# Patient Record
Sex: Female | Born: 1935
Health system: Southern US, Community
[De-identification: ages and names within clinical notes are randomized; demographics above are authoritative.]

---

## 2017-01-18 ENCOUNTER — Inpatient Hospital Stay
Admission: RE | Admit: 2017-01-18 | Discharge: 2017-02-17 | Disposition: E | Payer: Medicare Other | Attending: Internal Medicine | Admitting: Internal Medicine

## 2017-01-18 DIAGNOSIS — T148XXA Other injury of unspecified body region, initial encounter: Secondary | ICD-10-CM

## 2017-01-18 DIAGNOSIS — Z431 Encounter for attention to gastrostomy: Secondary | ICD-10-CM

## 2017-01-18 DIAGNOSIS — J9690 Respiratory failure, unspecified, unspecified whether with hypoxia or hypercapnia: Secondary | ICD-10-CM

## 2017-01-18 DIAGNOSIS — Z4659 Encounter for fitting and adjustment of other gastrointestinal appliance and device: Secondary | ICD-10-CM

## 2017-01-18 DIAGNOSIS — Z931 Gastrostomy status: Secondary | ICD-10-CM

## 2017-01-18 DIAGNOSIS — E46 Unspecified protein-calorie malnutrition: Secondary | ICD-10-CM

## 2017-01-18 DIAGNOSIS — L988 Other specified disorders of the skin and subcutaneous tissue: Secondary | ICD-10-CM

## 2017-01-19 ENCOUNTER — Other Ambulatory Visit (HOSPITAL_COMMUNITY): Payer: Medicare Other

## 2017-01-19 LAB — BASIC METABOLIC PANEL
ANION GAP: 10 (ref 5–15)
BUN: 62 mg/dL — ABNORMAL HIGH (ref 6–20)
CALCIUM: 8.6 mg/dL — AB (ref 8.9–10.3)
CHLORIDE: 100 mmol/L — AB (ref 101–111)
CO2: 26 mmol/L (ref 22–32)
Creatinine, Ser: 1.83 mg/dL — ABNORMAL HIGH (ref 0.44–1.00)
GFR calc non Af Amer: 25 mL/min — ABNORMAL LOW (ref >60)
GFR, EST AFRICAN AMERICAN: 29 mL/min — AB (ref >60)
GLUCOSE: 111 mg/dL — AB (ref 65–99)
POTASSIUM: 4.6 mmol/L (ref 3.5–5.1)
Sodium: 136 mmol/L (ref 135–145)

## 2017-01-19 LAB — PROTIME-INR
INR: 1.22
PROTHROMBIN TIME: 15.4 s — AB (ref 11.4–15.2)

## 2017-01-19 LAB — BLOOD GAS, ARTERIAL
Acid-Base Excess: 1 mmol/L (ref 0.0–2.0)
BICARBONATE: 25 mmol/L (ref 20.0–28.0)
FIO2: 50
O2 Saturation: 98.2 %
PATIENT TEMPERATURE: 97.9
PH ART: 7.427 (ref 7.350–7.450)
pCO2 arterial: 38.3 mmHg (ref 32.0–48.0)
pO2, Arterial: 109 mmHg — ABNORMAL HIGH (ref 83.0–108.0)

## 2017-01-19 LAB — CBC
HCT: 33.7 % — ABNORMAL LOW (ref 36.0–46.0)
HEMOGLOBIN: 10.6 g/dL — AB (ref 12.0–15.0)
MCH: 26.6 pg (ref 26.0–34.0)
MCHC: 31.5 g/dL (ref 30.0–36.0)
MCV: 84.5 fL (ref 78.0–100.0)
Platelets: 206 10*3/uL (ref 150–400)
RBC: 3.99 MIL/uL (ref 3.87–5.11)
RDW: 19.2 % — ABNORMAL HIGH (ref 11.5–15.5)
WBC: 5.7 10*3/uL (ref 4.0–10.5)

## 2017-01-20 ENCOUNTER — Other Ambulatory Visit (HOSPITAL_COMMUNITY): Payer: Medicare Other

## 2017-01-20 NOTE — Consult Note (Signed)
Date: 01/20/2017               Patient Name:  Veronica Mckinney MRN: [id number]  DOB: 1935-08-02 Age / Sex: 81 y.o., female        PCP: No primary care provider on file.              Service Requesting Consult: IM hospitalist              Reason for Consult: ARF       History of Present Illness: Patient is a 81 y.o. female who was admitted to [facility] on 01/23/2017   According to the available information in the chart, patient has medical problems of diabetes, anemia, hypertension, congestive heart failure and atrial fibrillation. She was brought from home when EMS on June 1 for general deconditioning and decline or several days prior to admission. EMS found her on the floor of the trailer. She had some bruising on her right side suggesting she may have fallen previously. Initially she was noted to be severely anemic with hemoglobin of 6, hypothermic, hypotensive, bradycardic with heart rate of 35. She received blood transfusions and supportive care in the ICU. She was admitted for sepsis from UTI and pneumonia.A 2-D echo during this admission showed severe pulmonary hypertension and diastolic dysfunction.When she did not respond to diuresis, she was started on dialysis. It is not clear from the discharge summary when exactly dialysis was started but the patient had a right IJ line placement June 15, trialysis catheter exchange on June 25 and a PermCath placed on June 28  At present, patient is very lethargic, moaning. She is now able to provide any information. She is currently on nasal cannula. She has a Foley catheter but very little urine. About 500 cc of urine output reported in the last 24 hours.    Medications: Outpatient medications: No prescriptions prior to admission.    Current medications: No current facility-administered medications for this encounter.     teflaro Acyclovir 200 mg twice a day Amiodarone 100 mg daily Lipitor 20 mg each bedtime Carbidopa levodopa 1 tablet 3 times a  day Heparin 5000 units subcutaneous Ipratropium albuterol 3 times a day Lantus 20 units twice a day Synthroid 25 mcg daily Pantoprazole 40 mg daily Prednisone 20 mg daily  Allergies: Allergies not on file    Past Medical History: No past medical history on file.   Past Surgical History: No past surgical history on file.   Family History: No family history on file.   Social History: Social History   Social History  . Marital status: N/A    Spouse name: N/A  . Number of children: N/A  . Years of education: N/A   Occupational History  . Not on file.   Social History Main Topics  . Smoking status: Not on file  . Smokeless tobacco: Not on file  . Alcohol use Not on file  . Drug use: Unknown  . Sexual activity: Not on file   Other Topics Concern  . Not on file   Social History Narrative  . No narrative on file     Review of Systems: n/a due to patient being lethargic. Gen:  HEENT:  CV:  Resp:  GI: GU :  MS:  Derm:   Psych: Heme:  Neuro:  Endocrine  Vital Signs: There were no vitals taken for this visit.  Ttemperature 98.7, pulse 66, respirations 24, blood pressure 108/48  Weight trends: There were no vitals filed for  this visit.  Physical Exam: General: Critically ill appearing, laying in the bed  HEENT NG tube in place, and oxygen by nasal cannula  Neck:  supple  Lungs: Bilateral crackles, increased respiratory rate  Heart:: Regular rhythm  Abdomen: Soft, nondistended, significant dependent skin edema in the abdominal pannus, rash under the pannus  Extremities:  some dependent edema in the arms, muscular atrophy in the legs  Neurologic: Lethargic, and did not respond to voice or tactile stimulation, moaning  Skin: Scattered ecchymosis all over her body  Access: Right IJ PermCath  Foley: present       Lab results: Basic Metabolic Panel:  Recent Labs Lab 01/19/17 0652  NA 136  K 4.6  CL 100*  CO2 26  GLUCOSE 111*  BUN 62*   CREATININE 1.83*  CALCIUM 8.6*    Liver Function Tests: No results for input(s): AST, ALT, ALKPHOS, BILITOT, PROT, ALBUMIN in the last 168 hours. No results for input(s): LIPASE, AMYLASE in the last 168 hours. No results for input(s): AMMONIA in the last 168 hours.  CBC:  Recent Labs Lab 01/19/17 0652  WBC 5.7  HGB 10.6*  HCT 33.7*  MCV 84.5  PLT 206    Cardiac Enzymes: No results for input(s): CKTOTAL, TROPONINI in the last 168 hours.  BNP: Invalid input(s): POCBNP  CBG: No results for input(s): GLUCAP in the last 168 hours.  Microbiology: No results found for this or any previous visit (from the past 720 hour(s)).   Coagulation Studies:  Recent Labs  01/19/17 0652  LABPROT 15.4*  INR 1.22    Urinalysis: No results for input(s): COLORURINE, LABSPEC, PHURINE, GLUCOSEU, HGBUR, BILIRUBINUR, KETONESUR, PROTEINUR, UROBILINOGEN, NITRITE, LEUKOCYTESUR in the last 72 hours.  Invalid input(s): APPERANCEUR      Imaging: Dg Chest Port 1 View  Result Date: 01/20/2017 CLINICAL DATA:  Respiratory failure. EXAM: PORTABLE CHEST 1 VIEW COMPARISON:  None. FINDINGS: Nasogastric tube enters the abdomen. Right internal jugular central line tips at the SVC RA junction or proximal right atrium. The patient is rotated markedly towards the right. There is probably pulmonary edema. There is a right effusion with right lower lobe collapse. Abnormal density in the left lower lobe could represent pneumonia. IMPRESSION: Markedly rotated film. Right effusion with right lower lobe collapse. Possible left lower lobe pneumonia. Suspicion of pulmonary edema/ fluid overload. Electronically Signed   By: Paulina FusiMark  Shogry M.D.   On: 01/20/2017 07:48   Dg Abd Portable 1v  Result Date: 01/19/2017 CLINICAL DATA:  81 y/o  F; nasogastric tube placement. EXAM: PORTABLE ABDOMEN - 1 VIEW COMPARISON:  None. FINDINGS: Normal bowel gas pattern. Enteric tube tip projects over right lower quadrant likely in distal  stomach. Right upper quadrant surgical clips from cholecystectomy. Ill-defined patchy opacifications of the lungs and small bilateral pleural effusions. Mildly enlarged cardiac silhouette. No radiopaque urinary stone disease identified. IMPRESSION: Enteric tube tip likely in distal stomach. Normal bowel gas pattern. Electronically Signed   By: Mitzi HansenLance  Furusawa-Stratton M.D.   On: 01/19/2017 04:47      Assessment & Plan: Pt is a 81 y.o. caucsian female  With diabetes, atrial fibrillation, hypertension, congestive heart failure, pulmonary hypertension, diastolic dysfunction.She was treated for sepsis from pneumonia and UTI, peri anal HSV at the outside hospital. Started on dialysis for volume overload and poor urine output. [facility]  1. Acute renal failure Baseline creatinine is unknown UOP is poor at present Patient was getting dialysis at the outside hospital.  It is not known when her last treatment was done. We will continue dialysis treatments 3 times a week for now and evaluate Plan for dialysis today for volume removal as exam and Chest x-ray suggests pulmonary edema  2. Acute pulmonary edema, right lower lobe pneumonia - Antibiotics as per primary team - dialysis today for fluid removal

## 2017-01-21 LAB — HEPATITIS B SURFACE ANTIBODY,QUALITATIVE: HEP B S AB: NONREACTIVE

## 2017-01-21 LAB — HEPATITIS B CORE ANTIBODY, IGM: Hep B C IgM: NEGATIVE

## 2017-01-21 LAB — HEPATITIS B SURFACE ANTIGEN: HEP B S AG: NEGATIVE

## 2017-01-22 LAB — CBC
HEMATOCRIT: 28.6 % — AB (ref 36.0–46.0)
HEMOGLOBIN: 9 g/dL — AB (ref 12.0–15.0)
MCH: 26.3 pg (ref 26.0–34.0)
MCHC: 31.5 g/dL (ref 30.0–36.0)
MCV: 83.6 fL (ref 78.0–100.0)
Platelets: 224 10*3/uL (ref 150–400)
RBC: 3.42 MIL/uL — ABNORMAL LOW (ref 3.87–5.11)
RDW: 19.2 % — ABNORMAL HIGH (ref 11.5–15.5)
WBC: 7 10*3/uL (ref 4.0–10.5)

## 2017-01-22 LAB — RENAL FUNCTION PANEL
ANION GAP: 10 (ref 5–15)
Albumin: 1.9 g/dL — ABNORMAL LOW (ref 3.5–5.0)
BUN: 114 mg/dL — ABNORMAL HIGH (ref 6–20)
CHLORIDE: 102 mmol/L (ref 101–111)
CO2: 23 mmol/L (ref 22–32)
Calcium: 8.3 mg/dL — ABNORMAL LOW (ref 8.9–10.3)
Creatinine, Ser: 2.97 mg/dL — ABNORMAL HIGH (ref 0.44–1.00)
GFR calc non Af Amer: 14 mL/min — ABNORMAL LOW (ref >60)
GFR, EST AFRICAN AMERICAN: 16 mL/min — AB (ref >60)
GLUCOSE: 100 mg/dL — AB (ref 65–99)
POTASSIUM: 4.8 mmol/L (ref 3.5–5.1)
Phosphorus: 5.9 mg/dL — ABNORMAL HIGH (ref 2.5–4.6)
SODIUM: 135 mmol/L (ref 135–145)

## 2017-01-22 NOTE — Progress Notes (Signed)
  01/22/17  Subjective:   Patient seen during HD Dialysis being done in chair Patient still lethargic; not responding to voice / stimulation   Objective:  Vital signs in last 24 hours:     T 95.7  RR 17  HR 73  BP 107/55  Intake/Output:   No intake or output data in the 24 hours ending 01/22/17 1533   Physical Exam: General: NAD, sitting up in chair  HEENT NG tube in place  Neck supple  Pulm/lungs Coarse b/l  CVS/Heart regular  Abdomen:  soft  Extremities: No edema  Neurologic: Lethargic, not following commands     Access: Rt IJ PC       Basic Metabolic Panel:   Recent Labs Lab 01/19/17 0652 01/22/17 0603  NA 136 135  K 4.6 4.8  CL 100* 102  CO2 26 23  GLUCOSE 111* 100*  BUN 62* 114*  CREATININE 1.83* 2.97*  CALCIUM 8.6* 8.3*  PHOS  --  5.9*     CBC:  Recent Labs Lab 01/19/17 0652 01/22/17 0603  WBC 5.7 7.0  HGB 10.6* 9.0*  HCT 33.7* 28.6*  MCV 84.5 83.6  PLT 206 224     Lab Results  Component Value Date   HEPBSAG Negative 01/20/2017   HEPBSAB Non Reactive 01/20/2017   HEPBIGM Negative 01/20/2017      Microbiology:  No results found for this or any previous visit (from the past 240 hour(s)).  Coagulation Studies: No results for input(s): LABPROT, INR in the last 72 hours.  Urinalysis: No results for input(s): COLORURINE, LABSPEC, PHURINE, GLUCOSEU, HGBUR, BILIRUBINUR, KETONESUR, PROTEINUR, UROBILINOGEN, NITRITE, LEUKOCYTESUR in the last 72 hours.  Invalid input(s): APPERANCEUR    Imaging: No results found.   Medications:       Assessment/ Plan:  81 y.o. 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 Started on dialysis for volume overload and poor urine output. [facility]  1. Acute renal  failure Baseline creatinine is unknown UOP is poor at present; S Creatinine and BUN have worsened Patient was getting dialysis at the outside hospital. Probably started between June 15 and June 25 We will continue dialysis treatments 3 times a week for now and evaluate Plan for dialysis today and Monday. Further plan as per patient progress - UF goal 2 L for monday  2. Acute pulmonary edema, right lower lobe pneumonia - Antibiotics as per primary team   3. Hyperphosphatemia - check PTh - start Calcium acetate 1334 TID   LOS: 0 [facility],Keonta Monceaux 7/6/20183:33 PM  [facility] [place], [place] [phone]

## 2017-01-25 LAB — RENAL FUNCTION PANEL
Albumin: 1.7 g/dL — ABNORMAL LOW (ref 3.5–5.0)
Anion gap: 9 (ref 5–15)
BUN: 111 mg/dL — ABNORMAL HIGH (ref 6–20)
CHLORIDE: 97 mmol/L — AB (ref 101–111)
CO2: 25 mmol/L (ref 22–32)
CREATININE: 3.05 mg/dL — AB (ref 0.44–1.00)
Calcium: 8.5 mg/dL — ABNORMAL LOW (ref 8.9–10.3)
GFR, EST AFRICAN AMERICAN: 16 mL/min — AB (ref >60)
GFR, EST NON AFRICAN AMERICAN: 14 mL/min — AB (ref >60)
Glucose, Bld: 263 mg/dL — ABNORMAL HIGH (ref 65–99)
Phosphorus: 4.6 mg/dL (ref 2.5–4.6)
Potassium: 4.9 mmol/L (ref 3.5–5.1)
Sodium: 131 mmol/L — ABNORMAL LOW (ref 135–145)

## 2017-01-25 LAB — CBC
HEMATOCRIT: 25.8 % — AB (ref 36.0–46.0)
HEMOGLOBIN: 8.1 g/dL — AB (ref 12.0–15.0)
MCH: 26.1 pg (ref 26.0–34.0)
MCHC: 31.4 g/dL (ref 30.0–36.0)
MCV: 83.2 fL (ref 78.0–100.0)
PLATELETS: 203 10*3/uL (ref 150–400)
RBC: 3.1 MIL/uL — AB (ref 3.87–5.11)
RDW: 18.8 % — ABNORMAL HIGH (ref 11.5–15.5)
WBC: 6 10*3/uL (ref 4.0–10.5)

## 2017-01-25 LAB — VANCOMYCIN, TROUGH: VANCOMYCIN TR: 8 ug/mL — AB (ref 15–20)

## 2017-01-25 NOTE — Progress Notes (Signed)
  01/25/17  Subjective:  Patient seen after hemodialysis. She appears to have tolerated well. Ultrafiltration achieved was 2 kg.  Objective:  Vital signs in last 24 hours:  Temperature 97.7 pulse 63 respirations 16 blood pressure 113/54  Physical Exam: General: NAD, laying in bed  HEENT [place]/AT NG tube in place  Neck supple  Pulm/lungs Coarse b/l, normal effort  CVS/Heart S1S2 no rubs  Abdomen:  Soft NT, BS present  Extremities: No edema  Neurologic: Lethargic, not following commands     Access: Rt IJ PC       Basic Metabolic Panel:   Recent Labs Lab 01/19/17 0652 01/22/17 0603 01/25/17 0717  NA 136 135 131*  K 4.6 4.8 4.9  CL 100* 102 97*  CO2 26 23 25   GLUCOSE 111* 100* 263*  BUN 62* 114* 111*  CREATININE 1.83* 2.97* 3.05*  CALCIUM 8.6* 8.3* 8.5*  PHOS  --  5.9* 4.6     CBC:  Recent Labs Lab 01/19/17 0652 01/22/17 0603 01/25/17 0717  WBC 5.7 7.0 6.0  HGB 10.6* 9.0* 8.1*  HCT 33.7* 28.6* 25.8*  MCV 84.5 83.6 83.2  PLT 206 224 203      Lab Results  Component Value Date   HEPBSAG Negative 01/20/2017   HEPBSAB Non Reactive 01/20/2017   HEPBIGM Negative 01/20/2017      Microbiology:  No results found for this or any previous visit (from the past 240 hour(s)).  Coagulation Studies: No results for input(s): LABPROT, INR in the last 72 hours.  Urinalysis: No results for input(s): COLORURINE, LABSPEC, PHURINE, GLUCOSEU, HGBUR, BILIRUBINUR, KETONESUR, PROTEINUR, UROBILINOGEN, NITRITE, LEUKOCYTESUR in the last 72 hours.  Invalid input(s): APPERANCEUR    Imaging: No results found.   Medications:       Assessment/ Plan:  81 y.o. 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 Started on dialysis for volume overload and poor urine output. [facility]  1. Acute renal failure Baseline creatinine is unknown UOP is poor at present; S Creatinine and BUN have worsened -  Patient seen post hemodialysis. Ultrafiltration achieved was 2 L. BUN remains to be quite high. Continue to monitor BUN and creatinine trend as well as urine output.  We will plan for dialysis again on Wednesday.  2. Acute pulmonary edema, right lower lobe pneumonia - Continue ultrafiltration with hemodialysis.  3. Secondary hyperparathyroidism. Phosphorus currently 4.6. Continue to monitor phosphorus with dialysis.   LOS: 0 Veronica HaagensenLATEEF, Veronica Mckinney 7/9/20183:36 PM  [facility] [place], [place] [phone]

## 2017-01-26 LAB — C DIFFICILE QUICK SCREEN W PCR REFLEX
C DIFFICLE (CDIFF) ANTIGEN: NEGATIVE
C Diff interpretation: NOT DETECTED
C Diff toxin: NEGATIVE

## 2017-01-27 LAB — VANCOMYCIN, TROUGH: Vancomycin Tr: 22 ug/mL (ref 15–20)

## 2017-01-27 LAB — RENAL FUNCTION PANEL
ALBUMIN: 1.7 g/dL — AB (ref 3.5–5.0)
ANION GAP: 6 (ref 5–15)
BUN: 78 mg/dL — ABNORMAL HIGH (ref 6–20)
CO2: 26 mmol/L (ref 22–32)
CREATININE: 2.22 mg/dL — AB (ref 0.44–1.00)
Calcium: 8.5 mg/dL — ABNORMAL LOW (ref 8.9–10.3)
Chloride: 99 mmol/L — ABNORMAL LOW (ref 101–111)
GFR calc Af Amer: 23 mL/min — ABNORMAL LOW (ref >60)
GFR calc non Af Amer: 20 mL/min — ABNORMAL LOW (ref >60)
GLUCOSE: 129 mg/dL — AB (ref 65–99)
PHOSPHORUS: 3.8 mg/dL (ref 2.5–4.6)
Potassium: 4.7 mmol/L (ref 3.5–5.1)
Sodium: 131 mmol/L — ABNORMAL LOW (ref 135–145)

## 2017-01-27 LAB — CBC
HCT: 29 % — ABNORMAL LOW (ref 36.0–46.0)
HEMOGLOBIN: 9.1 g/dL — AB (ref 12.0–15.0)
MCH: 26.7 pg (ref 26.0–34.0)
MCHC: 31.4 g/dL (ref 30.0–36.0)
MCV: 85 fL (ref 78.0–100.0)
PLATELETS: 167 10*3/uL (ref 150–400)
RBC: 3.41 MIL/uL — AB (ref 3.87–5.11)
RDW: 19 % — ABNORMAL HIGH (ref 11.5–15.5)
WBC: 7.3 10*3/uL (ref 4.0–10.5)

## 2017-01-27 NOTE — Progress Notes (Signed)
  01/27/17  Subjective:  Patient seems much more awake and alert at the moment. She is due for dialysis today.  orders have been prepared.  Objective:  Vital signs in last 24 hours:  Temperature 98 pulse 56 respirations 12 blood pressure 137/64  Physical Exam: General: NAD, laying in bed  HEENT [place]/AT NG tube in place  Neck supple  Pulm/lungs Coarse b/l, normal effort  CVS/Heart S1S2 no rubs  Abdomen:  Soft NT, BS present  Extremities: No edema  Neurologic: Lethargic, not following commands     Access: Rt IJ PC       Basic Metabolic Panel:   Recent Labs Lab 01/22/17 0603 01/25/17 0717 01/27/17 0656  NA 135 131* 131*  K 4.8 4.9 4.7  CL 102 97* 99*  CO2 23 25 26   GLUCOSE 100* 263* 129*  BUN 114* 111* 78*  CREATININE 2.97* 3.05* 2.22*  CALCIUM 8.3* 8.5* 8.5*  PHOS 5.9* 4.6 3.8     CBC:  Recent Labs Lab 01/22/17 0603 01/25/17 0717 01/27/17 0656  WBC 7.0 6.0 7.3  HGB 9.0* 8.1* 9.1*  HCT 28.6* 25.8* 29.0*  MCV 83.6 83.2 85.0  PLT 224 203 167      Lab Results  Component Value Date   HEPBSAG Negative 01/20/2017   HEPBSAB Non Reactive 01/20/2017   HEPBIGM Negative 01/20/2017      Microbiology:  Recent Results (from the past 240 hour(s))  C difficile quick scan w PCR reflex     Status: None   Collection Time: 01/25/17  6:57 AM  Result Value Ref Range Status   C Diff antigen NEGATIVE NEGATIVE Final   C Diff toxin NEGATIVE NEGATIVE Final   C Diff interpretation No C. difficile detected.  Final    Coagulation Studies: No results for input(s): LABPROT, INR in the last 72 hours.  Urinalysis: No results for input(s): COLORURINE, LABSPEC, PHURINE, GLUCOSEU, HGBUR, BILIRUBINUR, KETONESUR, PROTEINUR, UROBILINOGEN, NITRITE, LEUKOCYTESUR in the last 72 hours.  Invalid input(s): APPERANCEUR    Imaging: No results found.   Medications:       Assessment/ Plan:  81 y.o. female with diabetes, atrial fibrillation, hypertension, congestive heart  failure, pulmonary hypertension, diastolic dysfunction.She was treated for sepsis from pneumonia and UTI, peri anal HSV at the outside hospital. Started on dialysis for volume overload and poor urine output. [facility]  1. Acute renal failure Baseline creatinine is unknown -  Patient due for hemodialysis today. We will plan for dialysis tentatively for Friday as well. Hopefully her renal function will improve with time.  2. Acute pulmonary edema, right lower lobe pneumonia - Breathing comfortably at the moment. We will continue to plan ultrafiltration target's of 1-1.5 kg daily.  3. Secondary hyperparathyroidism.  Phosphorus down to 3.8. Continue to monitor bone mineral metabolism parameters.  LOS: 0 Mady HaagensenLATEEF, Thetis Schwimmer 7/11/20183:17 PM  [facility] [place], [place] [phone]

## 2017-01-29 LAB — RENAL FUNCTION PANEL
ALBUMIN: 1.6 g/dL — AB (ref 3.5–5.0)
Anion gap: 6 (ref 5–15)
BUN: 61 mg/dL — ABNORMAL HIGH (ref 6–20)
CALCIUM: 8.4 mg/dL — AB (ref 8.9–10.3)
CO2: 26 mmol/L (ref 22–32)
CREATININE: 1.97 mg/dL — AB (ref 0.44–1.00)
Chloride: 99 mmol/L — ABNORMAL LOW (ref 101–111)
GFR calc non Af Amer: 23 mL/min — ABNORMAL LOW (ref >60)
GFR, EST AFRICAN AMERICAN: 27 mL/min — AB (ref >60)
Glucose, Bld: 65 mg/dL (ref 65–99)
PHOSPHORUS: 2.4 mg/dL — AB (ref 2.5–4.6)
Potassium: 3.9 mmol/L (ref 3.5–5.1)
SODIUM: 131 mmol/L — AB (ref 135–145)

## 2017-01-29 LAB — CBC
HCT: 24.1 % — ABNORMAL LOW (ref 36.0–46.0)
Hemoglobin: 7.8 g/dL — ABNORMAL LOW (ref 12.0–15.0)
MCH: 26.9 pg (ref 26.0–34.0)
MCHC: 32.4 g/dL (ref 30.0–36.0)
MCV: 83.1 fL (ref 78.0–100.0)
PLATELETS: 103 10*3/uL — AB (ref 150–400)
RBC: 2.9 MIL/uL — AB (ref 3.87–5.11)
RDW: 19 % — ABNORMAL HIGH (ref 11.5–15.5)
WBC: 8.1 10*3/uL (ref 4.0–10.5)

## 2017-01-29 NOTE — Progress Notes (Signed)
  01/29/17  Subjective:  Patient appears to be lethargic today. She will be due for dialysis later today.   Objective:  Vital signs in last 24 hours:  Temperature 100.8 pulse 75 respirations 18 blood pressure 122/61  Physical Exam: General: NAD, laying in bed  HEENT [place]/AT NG tube in place  Neck supple  Pulm/lungs Coarse b/l, normal effort  CVS/Heart S1S2 no rubs  Abdomen:  Soft NT, BS present  Extremities: No edema  Neurologic: Lethargic, not following commands  Skin: Scattered ecchymoses  Access: Rt IJ PC       Basic Metabolic Panel:   Recent Labs Lab 01/25/17 0717 01/27/17 0656 01/29/17 0455  NA 131* 131* 131*  K 4.9 4.7 3.9  CL 97* 99* 99*  CO2 25 26 26   GLUCOSE 263* 129* 65  BUN 111* 78* 61*  CREATININE 3.05* 2.22* 1.97*  CALCIUM 8.5* 8.5* 8.4*  PHOS 4.6 3.8 2.4*     CBC:  Recent Labs Lab 01/25/17 0717 01/27/17 0656 01/29/17 0455  WBC 6.0 7.3 8.1  HGB 8.1* 9.1* 7.8*  HCT 25.8* 29.0* 24.1*  MCV 83.2 85.0 83.1  PLT 203 167 103*      Lab Results  Component Value Date   HEPBSAG Negative 01/20/2017   HEPBSAB Non Reactive 01/20/2017   HEPBIGM Negative 01/20/2017      Microbiology:  Recent Results (from the past 240 hour(s))  C difficile quick scan w PCR reflex     Status: None   Collection Time: 01/25/17  6:57 AM  Result Value Ref Range Status   C Diff antigen NEGATIVE NEGATIVE Final   C Diff toxin NEGATIVE NEGATIVE Final   C Diff interpretation No C. difficile detected.  Final    Coagulation Studies: No results for input(s): LABPROT, INR in the last 72 hours.  Urinalysis: No results for input(s): COLORURINE, LABSPEC, PHURINE, GLUCOSEU, HGBUR, BILIRUBINUR, KETONESUR, PROTEINUR, UROBILINOGEN, NITRITE, LEUKOCYTESUR in the last 72 hours.  Invalid input(s): APPERANCEUR    Imaging: No results found.   Medications:       Assessment/ Plan:  81 y.o. female with diabetes, atrial fibrillation, hypertension, congestive heart  failure, pulmonary hypertension, diastolic dysfunction.She was treated for sepsis from pneumonia and UTI, peri anal HSV at the outside hospital. Started on dialysis for volume overload and poor urine output. [facility]  1. Acute renal failure Baseline creatinine is unknown - We will plan for dialysis again today. Thereafter we will plan for dialysis again on Monday. Unclear as to whether she will regain enough kidney function to be off of dialysis.  2. Acute pulmonary edema, right lower lobe pneumonia - Ultrafiltration target 1.5 kg today.  3. Secondary hyperparathyroidism.  Phos currently 2.4, continue to monitor phos trend.     LOS: 0 Veronica HaagensenLATEEF, Veronica Mckinney 7/13/20188:18 AM  [facility] Veronica Mckinney, [place] [phone]

## 2017-02-01 LAB — CBC
HEMATOCRIT: 25.6 % — AB (ref 36.0–46.0)
HEMOGLOBIN: 8.1 g/dL — AB (ref 12.0–15.0)
MCH: 26.6 pg (ref 26.0–34.0)
MCHC: 31.6 g/dL (ref 30.0–36.0)
MCV: 84.2 fL (ref 78.0–100.0)
Platelets: 89 10*3/uL — ABNORMAL LOW (ref 150–400)
RBC: 3.04 MIL/uL — ABNORMAL LOW (ref 3.87–5.11)
RDW: 19.7 % — ABNORMAL HIGH (ref 11.5–15.5)
WBC: 5.6 10*3/uL (ref 4.0–10.5)

## 2017-02-01 LAB — RENAL FUNCTION PANEL
ALBUMIN: 1.6 g/dL — AB (ref 3.5–5.0)
ANION GAP: 6 (ref 5–15)
BUN: 89 mg/dL — ABNORMAL HIGH (ref 6–20)
CALCIUM: 8.9 mg/dL (ref 8.9–10.3)
CO2: 28 mmol/L (ref 22–32)
Chloride: 94 mmol/L — ABNORMAL LOW (ref 101–111)
Creatinine, Ser: 2.48 mg/dL — ABNORMAL HIGH (ref 0.44–1.00)
GFR calc Af Amer: 20 mL/min — ABNORMAL LOW (ref >60)
GFR, EST NON AFRICAN AMERICAN: 17 mL/min — AB (ref >60)
Glucose, Bld: 99 mg/dL (ref 65–99)
PHOSPHORUS: 2.8 mg/dL (ref 2.5–4.6)
POTASSIUM: 4.2 mmol/L (ref 3.5–5.1)
Sodium: 128 mmol/L — ABNORMAL LOW (ref 135–145)

## 2017-02-01 LAB — PROTIME-INR
INR: 1.08
PROTHROMBIN TIME: 14 s (ref 11.4–15.2)

## 2017-02-01 NOTE — Progress Notes (Signed)
  02/01/17  Subjective:  Patient continues to be lethargic. Patient seen during dialysis Tolerating well    Objective:  Vital signs in last 24 hours:  Temperature  94.1, pulse 60, respirations 18, blood pressure 120/57  Physical Exam: General: NAD, laying in bed  HEENT [place]/AT NG tube in place  Neck supple  Pulm/lungs Coarse b/l, normal effort  CVS/Heart Irregular rhythm   Abdomen:  Soft NT, BS present  Extremities: + edema  Neurologic: Lethargic, not following commands  Skin: Scattered ecchymoses  Access: Rt IJ PC       Basic Metabolic Panel:   Recent Labs Lab 01/27/17 0656 01/29/17 0455 02/01/17 0659  NA 131* 131* 128*  K 4.7 3.9 4.2  CL 99* 99* 94*  CO2 26 26 28   GLUCOSE 129* 65 99  BUN 78* 61* 89*  CREATININE 2.22* 1.97* 2.48*  CALCIUM 8.5* 8.4* 8.9  PHOS 3.8 2.4* 2.8     CBC:  Recent Labs Lab 01/27/17 0656 01/29/17 0455 02/01/17 0659  WBC 7.3 8.1 5.6  HGB 9.1* 7.8* 8.1*  HCT 29.0* 24.1* 25.6*  MCV 85.0 83.1 84.2  PLT 167 103* 89*      Lab Results  Component Value Date   HEPBSAG Negative 01/20/2017   HEPBSAB Non Reactive 01/20/2017   HEPBIGM Negative 01/20/2017      Microbiology:  Recent Results (from the past 240 hour(s))  C difficile quick scan w PCR reflex     Status: None   Collection Time: 01/25/17  6:57 AM  Result Value Ref Range Status   C Diff antigen NEGATIVE NEGATIVE Final   C Diff toxin NEGATIVE NEGATIVE Final   C Diff interpretation No C. difficile detected.  Final    Coagulation Studies:  Recent Labs  02/01/17 0659  LABPROT 14.0  INR 1.08    Urinalysis: No results for input(s): COLORURINE, LABSPEC, PHURINE, GLUCOSEU, HGBUR, BILIRUBINUR, KETONESUR, PROTEINUR, UROBILINOGEN, NITRITE, LEUKOCYTESUR in the last 72 hours.  Invalid input(s): APPERANCEUR    Imaging: No results found.   Medications:       Assessment/ Plan:  81 y.o. female with diabetes, atrial fibrillation, hypertension, congestive heart  failure, pulmonary hypertension, diastolic dysfunction.She was treated for sepsis from pneumonia and UTI, peri anal HSV at the outside hospital. Started on dialysis for volume overload and poor urine output. [facility]  1. Acute renal failure Baseline creatinine is unknown - Patient to continue dialysis on M-W-F schedule for now - bladder incontinence- UOP not recorded - BUN/Cr remain high  2. Acute pulmonary edema, right lower lobe pneumonia - Ultrafiltration with HD as tolerated  Consider d/c maintenance iv fluids    LOS: 0 [facility]H,Jaiveon Suppes 7/16/20183:25 PM  [facility] [place], [place] [phone]

## 2017-02-02 ENCOUNTER — Other Ambulatory Visit (HOSPITAL_COMMUNITY): Payer: Medicare Other

## 2017-02-02 MED ORDER — IOPAMIDOL (ISOVUE-300) INJECTION 61%
100.0000 mL | Freq: Once | INTRAVENOUS | Status: AC | PRN
  Administered 2017-02-02: 100 mL via INTRAVENOUS

## 2017-02-02 NOTE — Progress Notes (Signed)
Patient ID: Veronica Mckinney, female   DOB: [birth year], 81 y.o.   MRN: [id number]  Request has been made for percutaneous gastric tube placement Renal failure; CHF; Afib; malnutrition Deconditioning; dysphagia Dr Archer AsaMcCullough has reviewed imaging and approves procedure But Several additional findings noted:  have been made aware to Priya NP Will HOLD on G tube until PMD has had a chance to evaluate the findings of CT plt 89 yesterday-HIT panel ongoing Heparin injection held  CT Abd IMPRESSION: 1. Incompletely imaged multifocal nodular opacities throughout the lungs the largest of which measures 1.5 cm in the left upper lobe. Differential considerations include multifocal pneumonia, and metastatic disease. If the patient does not clearly have multifocal pneumonia, then further evaluation with a dedicated chest CT is recommended. If the patient does have clinical symptoms of a pneumonia, then a follow-up CT scan of the chest is recommended in 4- 6 weeks following an appropriate course of therapy to document resolution of the nodular opacities. 2. Intermediate attenuation density in the subcutaneous fat of the right upper chest superior to the breast is favored to represent a subcutaneous hematoma. An incompletely imaged and superiorly displaced breast prostheses or other soft tissue mass is possible but considered less likely given the density in the absence of associated calcifications. This finding could also be further evaluated by CT scan of the chest. 3. Incompletely imaged but suspected left psoas hematoma versus abscess with adjacent retroperitoneal fluid collection. Consider further evaluation with a CT scan of the abdomen and pelvis with intravenous contrast. 4. Normal gastric anatomy for percutaneous gastrostomy tube placement. 5. Mild intra and extrahepatic biliary ductal dilatation is favored to be secondary to patient age and post cholecystectomy status. Recommend clinical  correlation with serum LFTs and bilirubin to evaluate for evidence of obstruction. 6.  Aortic Atherosclerosis (ICD10-170.0) 7. Cardiomegaly. 8. Coronary artery calcifications. 9. Small bilateral pleural effusions. 10. Anasarca. 11. The tip of the nasogastric tube is positioned in the first portion of the duodenum. Consider pulling back several cm for more optimal placement. 12. Well-positioned hemodialysis catheter with the tip at the superior cavoatrial junction    [facility] to let us know plan when appropriate

## 2017-02-02 NOTE — Consult Note (Deleted)
Chief Complaint: Patient was seen in consultation today for tunneled dialysis catheter placement at the request of Dr Ardeth SportsmanA Hijazi  Referring Physician(s): Dr A Hijazi Dr Mosetta PigeonHarmeet Singh  Supervising Physician: Irish LackYamagata, Glenn  [facility]  History of Present Illness: Veronica Mckinney is a 81 y.o. female   SOB; PNA +small PE Lovenox inj daily Respiratory failure Intubated and extubated Re intubated 7/2---to [facility] Acute renal failure Temporary dialysis catheter in Right femoral access Now pt with temp of 101.6 Wbc wnl Lovenox held Request for tunneled dialysis catheter placement   No past medical history on file.  No past surgical history on file.  Allergies: Patient has no allergy information on record.  Medications: Prior to Admission medications   Not on File     No family history on file.  Social History   Social History  . Marital status: N/A    Spouse name: N/A  . Number of children: N/A  . Years of education: N/A   Social History Main Topics  . Smoking status: Not on file  . Smokeless tobacco: Not on file  . Alcohol use Not on file  . Drug use: Unknown  . Sexual activity: Not on file   Other Topics Concern  . Not on file   Social History Narrative  . No narrative on file    Review of Systems: A 12 point ROS discussed and pertinent positives are indicated in the HPI above.  All other systems are negative.  Review of Systems  Respiratory:       Vent    Vital Signs: There were no vitals taken for this visit.  Physical Exam  Cardiovascular: Normal rate and regular rhythm.   Pulmonary/Chest:  vent  Abdominal: Soft.  Musculoskeletal:  No movement   Neurological:  No response  Skin: Skin is warm.  Nursing note and vitals reviewed.   Mallampati Score:  MD Evaluation Airway: Other (comments) Airway comments: vent Heart: WNL Abdomen: WNL Chest/ Lungs: Other (comments) Chest/ lungs comments: vent ASA   Classification: 3 Mallampati/Airway Score: Three  Imaging: Ct Abdomen Wo Contrast  Result Date: 02/02/2017 CLINICAL DATA:  81 year old female with protein calorie malnutrition. Evaluate anatomy for percutaneous gastrostomy tube placement. EXAM: CT ABDOMEN WITHOUT CONTRAST TECHNIQUE: Multidetector CT imaging of the abdomen was performed following the standard protocol without IV contrast. COMPARISON:  Abdominal radiograph 01/19/2017; chest x-ray 01/20/2017 FINDINGS: Lower chest: Respiratory motion limits evaluation of the visualized lower lungs. However, there is a 1.5 cm nodular opacity in the left upper lobe with additional patchy and somewhat nodular opacities in the left lower lobe. The bronchial wall thickening and bronchiectasis is present in the visualized portion of the right lung with partial atelectasis and/ or scarring in the right middle lobe and lingula. A few small nodular opacities are also present in the right lower lobe. Small bilateral pleural effusions. Cardiomegaly. Extensive calcifications along the course of the coronary arteries. A tunneled hemodialysis catheter is present. The tip terminates at the superior cavoatrial junction. A nasogastric tube is present. The tip terminates in the first portion of the duodenum. Hepatobiliary: The gallbladder is surgically absent. Mild intra and extrahepatic biliary duct dilatation without definite mass or stone. The liver parenchyma is high in attenuation. Pancreas: Atrophic.  No definite mass. Spleen: Normal in size without focal abnormality. Adrenals/Urinary Tract: No focal adrenal mass. No hydronephrosis or nephrolithiasis. Stomach/Bowel: No evidence of bowel obstruction or focal bowel wall thickening in the visualized portions. Normal gastric  anatomy. No evidence of bypass surgery or other abnormality. Vascular/Lymphatic: Limited evaluation in the absence of intravenous contrast. Calcifications present throughout the abdominal aorta. No aneurysm  visualized. Other: Extensive skin thickening and interstitial stranding throughout the subcutaneous fat consistent with anasarca. Musculoskeletal: Incompletely imaged 8.8 x 4.4 cm high attenuation soft tissue mass in the subcutaneous fat of the right upper chest. This abnormality lies above the breast. The contralateral side is unremarkable. Additionally, there is asymmetry in the psoas muscles with relative high attenuation and expansion on the left psoas muscle concerning for retroperitoneal hematoma. Additionally, there are what appear to be fluid collections in the retroperitoneal fat posterior to the left psoas muscle. IMPRESSION: 1. Incompletely imaged multifocal nodular opacities throughout the lungs the largest of which measures 1.5 cm in the left upper lobe. Differential considerations include multifocal pneumonia, and metastatic disease. If the patient does not clearly have multifocal pneumonia, then further evaluation with a dedicated chest CT is recommended. If the patient does have clinical symptoms of a pneumonia, then a follow-up CT scan of the chest is recommended in 4- 6 weeks following an appropriate course of therapy to document resolution of the nodular opacities. 2. Intermediate attenuation density in the subcutaneous fat of the right upper chest superior to the breast is favored to represent a subcutaneous hematoma. An incompletely imaged and superiorly displaced breast prostheses or other soft tissue mass is possible but considered less likely given the density in the absence of associated calcifications. This finding could also be further evaluated by CT scan of the chest. 3. Incompletely imaged but suspected left psoas hematoma versus abscess with adjacent retroperitoneal fluid collection. Consider further evaluation with a CT scan of the abdomen and pelvis with intravenous contrast. 4. Normal gastric anatomy for percutaneous gastrostomy tube placement. 5. Mild intra and extrahepatic biliary  ductal dilatation is favored to be secondary to patient age and post cholecystectomy status. Recommend clinical correlation with serum LFTs and bilirubin to evaluate for evidence of obstruction. 6.  Aortic Atherosclerosis (ICD10-170.0) 7. Cardiomegaly. 8. Coronary artery calcifications. 9. Small bilateral pleural effusions. 10. Anasarca. 11. The tip of the nasogastric tube is positioned in the first portion of the duodenum. Consider pulling back several cm for more optimal placement. 12. Well-positioned hemodialysis catheter with the tip at the superior cavoatrial junction. Electronically Signed   By: Malachy Moan M.D.   On: 02/02/2017 07:52   Dg Chest Port 1 View  Result Date: 01/20/2017 CLINICAL DATA:  Respiratory failure. EXAM: PORTABLE CHEST 1 VIEW COMPARISON:  None. FINDINGS: Nasogastric tube enters the abdomen. Right internal jugular central line tips at the SVC RA junction or proximal right atrium. The patient is rotated markedly towards the right. There is probably pulmonary edema. There is a right effusion with right lower lobe collapse. Abnormal density in the left lower lobe could represent pneumonia. IMPRESSION: Markedly rotated film. Right effusion with right lower lobe collapse. Possible left lower lobe pneumonia. Suspicion of pulmonary edema/ fluid overload. Electronically Signed   By: Paulina Fusi M.D.   On: 01/20/2017 07:48   Dg Abd Portable 1v  Result Date: 01/19/2017 CLINICAL DATA:  81 y/o  F; nasogastric tube placement. EXAM: PORTABLE ABDOMEN - 1 VIEW COMPARISON:  None. FINDINGS: Normal bowel gas pattern. Enteric tube tip projects over right lower quadrant likely in distal stomach. Right upper quadrant surgical clips from cholecystectomy. Ill-defined patchy opacifications of the lungs and small bilateral pleural effusions. Mildly enlarged cardiac silhouette. No radiopaque urinary stone disease identified. IMPRESSION: Enteric  tube tip likely in distal stomach. Normal bowel gas pattern.  Electronically Signed   By: Mitzi Hansen M.D.   On: 01/19/2017 04:47    Labs:  CBC:  Recent Labs  01/25/17 0717 01/27/17 0656 01/29/17 0455 02/01/17 0659  WBC 6.0 7.3 8.1 5.6  HGB 8.1* 9.1* 7.8* 8.1*  HCT 25.8* 29.0* 24.1* 25.6*  PLT 203 167 103* 89*    COAGS:  Recent Labs  01/19/17 0652 02/01/17 0659  INR 1.22 1.08    BMP:  Recent Labs  01/25/17 0717 01/27/17 0656 01/29/17 0455 02/01/17 0659  NA 131* 131* 131* 128*  K 4.9 4.7 3.9 4.2  CL 97* 99* 99* 94*  CO2 25 26 26 28   GLUCOSE 263* 129* 65 99  BUN 111* 78* 61* 89*  CALCIUM 8.5* 8.5* 8.4* 8.9  CREATININE 3.05* 2.22* 1.97* 2.48*  GFRNONAA 14* 20* 23* 17*  GFRAA 16* 23* 27* 20*    LIVER FUNCTION TESTS:  Recent Labs  01/25/17 0717 01/27/17 0656 01/29/17 0455 02/01/17 0659  ALBUMIN 1.7* 1.7* 1.6* 1.6*    TUMOR MARKERS: No results for input(s): AFPTM, CEA, CA199, CHROMGRNA in the last 8760 hours.  Assessment and Plan:  Temporary dialysis access right femoral site from outside facility Now with fever; TM 101.6 Ongoing tx of PNA; UTI Scheduled for tunneled dialysis catheter placement ** pt must be afebrile for tunneled cath placement---consider removal of temp acces if feel a source of infection; IR can replace temporary access if need... Tunneled catheter placement when pt is afebrile 48 hrs. RN gaining consent from family for perm cath Let us know if need help with Temp access for now. IR will keep pt on Radar   Thank you for this interesting consult.  I greatly enjoyed meeting Kendal Ghazarian and look forward to participating in their care.  A copy of this report was sent to the requesting provider on this date.  Electronically Signed: Jyll Tomaro A, PA-C 02/02/2017, 8:00 AM   I spent a total of 20 Minutes    in face to face in clinical consultation, greater than 50% of which was counseling/coordinating care for tunneled dialysis catheter placement

## 2017-02-03 LAB — RENAL FUNCTION PANEL
ALBUMIN: 1.7 g/dL — AB (ref 3.5–5.0)
Anion gap: 6 (ref 5–15)
BUN: 45 mg/dL — ABNORMAL HIGH (ref 6–20)
CALCIUM: 8.6 mg/dL — AB (ref 8.9–10.3)
CO2: 26 mmol/L (ref 22–32)
CREATININE: 1.92 mg/dL — AB (ref 0.44–1.00)
Chloride: 96 mmol/L — ABNORMAL LOW (ref 101–111)
GFR calc Af Amer: 27 mL/min — ABNORMAL LOW (ref >60)
GFR calc non Af Amer: 24 mL/min — ABNORMAL LOW (ref >60)
GLUCOSE: 146 mg/dL — AB (ref 65–99)
Phosphorus: 1.9 mg/dL — ABNORMAL LOW (ref 2.5–4.6)
Potassium: 4.3 mmol/L (ref 3.5–5.1)
SODIUM: 128 mmol/L — AB (ref 135–145)

## 2017-02-03 LAB — CBC
HCT: 26.8 % — ABNORMAL LOW (ref 36.0–46.0)
Hemoglobin: 8.7 g/dL — ABNORMAL LOW (ref 12.0–15.0)
MCH: 27.2 pg (ref 26.0–34.0)
MCHC: 32.5 g/dL (ref 30.0–36.0)
MCV: 83.8 fL (ref 78.0–100.0)
Platelets: 100 10*3/uL — ABNORMAL LOW (ref 150–400)
RBC: 3.2 MIL/uL — AB (ref 3.87–5.11)
RDW: 19.9 % — ABNORMAL HIGH (ref 11.5–15.5)
WBC: 7.3 10*3/uL (ref 4.0–10.5)

## 2017-02-03 LAB — HEPARIN INDUCED PLATELET AB (HIT ANTIBODY): HEPARIN INDUCED PLT AB: 0.274 {OD_unit} (ref 0.000–0.400)

## 2017-02-03 NOTE — Progress Notes (Signed)
02/03/17  Subjective:  Patient continues to be lethargic. Patient seen during dialysis Tolerating fair. Currently dialyzing in chair   Objective:  Vital signs in last 24 hours:  Temperature  99.7, pulse 66, respirations 36, blood pressure 174/71  Physical Exam: General: NAD   HEENT [place]/AT   Neck supple  Pulm/lungs Coarse b/l, normal effort  CVS/Heart Irregular rhythm   Abdomen:  Soft NT,   Extremities: + dependent edema  Neurologic: Lethargic, not following commands  Skin: Scattered ecchymoses  Access: Rt IJ PC       Basic Metabolic Panel:   Recent Labs Lab 01/29/17 0455 02/01/17 0659 02/03/17 0606  NA 131* 128* 128*  K 3.9 4.2 4.3  CL 99* 94* 96*  CO2 26 28 26   GLUCOSE 65 99 146*  BUN 61* 89* 45*  CREATININE 1.97* 2.48* 1.92*  CALCIUM 8.4* 8.9 8.6*  PHOS 2.4* 2.8 1.9*     CBC:  Recent Labs Lab 01/29/17 0455 02/01/17 0659 02/03/17 0606  WBC 8.1 5.6 7.3  HGB 7.8* 8.1* 8.7*  HCT 24.1* 25.6* 26.8*  MCV 83.1 84.2 83.8  PLT 103* 89* 100*      Lab Results  Component Value Date   HEPBSAG Negative 01/20/2017   HEPBSAB Non Reactive 01/20/2017   HEPBIGM Negative 01/20/2017      Microbiology:  Recent Results (from the past 240 hour(s))  C difficile quick scan w PCR reflex     Status: None   Collection Time: 01/25/17  6:57 AM  Result Value Ref Range Status   C Diff antigen NEGATIVE NEGATIVE Final   C Diff toxin NEGATIVE NEGATIVE Final   C Diff interpretation No C. difficile detected.  Final    Coagulation Studies:  Recent Labs  02/01/17 0659  LABPROT 14.0  INR 1.08    Urinalysis: No results for input(s): COLORURINE, LABSPEC, PHURINE, GLUCOSEU, HGBUR, BILIRUBINUR, KETONESUR, PROTEINUR, UROBILINOGEN, NITRITE, LEUKOCYTESUR in the last 72 hours.  Invalid input(s): APPERANCEUR    Imaging: Ct Abdomen Wo Contrast  Result Date: 02/02/2017 CLINICAL DATA:  81 year old female with protein calorie malnutrition. Evaluate anatomy for  percutaneous gastrostomy tube placement. EXAM: CT ABDOMEN WITHOUT CONTRAST TECHNIQUE: Multidetector CT imaging of the abdomen was performed following the standard protocol without IV contrast. COMPARISON:  Abdominal radiograph 01/19/2017; chest x-ray 01/20/2017 FINDINGS: Lower chest: Respiratory motion limits evaluation of the visualized lower lungs. However, there is a 1.5 cm nodular opacity in the left upper lobe with additional patchy and somewhat nodular opacities in the left lower lobe. The bronchial wall thickening and bronchiectasis is present in the visualized portion of the right lung with partial atelectasis and/ or scarring in the right middle lobe and lingula. A few small nodular opacities are also present in the right lower lobe. Small bilateral pleural effusions. Cardiomegaly. Extensive calcifications along the course of the coronary arteries. A tunneled hemodialysis catheter is present. The tip terminates at the superior cavoatrial junction. A nasogastric tube is present. The tip terminates in the first portion of the duodenum. Hepatobiliary: The gallbladder is surgically absent. Mild intra and extrahepatic biliary duct dilatation without definite mass or stone. The liver parenchyma is high in attenuation. Pancreas: Atrophic.  No definite mass. Spleen: Normal in size without focal abnormality. Adrenals/Urinary Tract: No focal adrenal mass. No hydronephrosis or nephrolithiasis. Stomach/Bowel: No evidence of bowel obstruction or focal bowel wall thickening in the visualized portions. Normal gastric anatomy. No evidence of bypass surgery or other abnormality. Vascular/Lymphatic: Limited evaluation in the absence of intravenous contrast. Calcifications  present throughout the abdominal aorta. No aneurysm visualized. Other: Extensive skin thickening and interstitial stranding throughout the subcutaneous fat consistent with anasarca. Musculoskeletal: Incompletely imaged 8.8 x 4.4 cm high attenuation soft  tissue mass in the subcutaneous fat of the right upper chest. This abnormality lies above the breast. The contralateral side is unremarkable. Additionally, there is asymmetry in the psoas muscles with relative high attenuation and expansion on the left psoas muscle concerning for retroperitoneal hematoma. Additionally, there are what appear to be fluid collections in the retroperitoneal fat posterior to the left psoas muscle. IMPRESSION: 1. Incompletely imaged multifocal nodular opacities throughout the lungs the largest of which measures 1.5 cm in the left upper lobe. Differential considerations include multifocal pneumonia, and metastatic disease. If the patient does not clearly have multifocal pneumonia, then further evaluation with a dedicated chest CT is recommended. If the patient does have clinical symptoms of a pneumonia, then a follow-up CT scan of the chest is recommended in 4- 6 weeks following an appropriate course of therapy to document resolution of the nodular opacities. 2. Intermediate attenuation density in the subcutaneous fat of the right upper chest superior to the breast is favored to represent a subcutaneous hematoma. An incompletely imaged and superiorly displaced breast prostheses or other soft tissue mass is possible but considered less likely given the density in the absence of associated calcifications. This finding could also be further evaluated by CT scan of the chest. 3. Incompletely imaged but suspected left psoas hematoma versus abscess with adjacent retroperitoneal fluid collection. Consider further evaluation with a CT scan of the abdomen and pelvis with intravenous contrast. 4. Normal gastric anatomy for percutaneous gastrostomy tube placement. 5. Mild intra and extrahepatic biliary ductal dilatation is favored to be secondary to patient age and post cholecystectomy status. Recommend clinical correlation with serum LFTs and bilirubin to evaluate for evidence of obstruction. 6.   Aortic Atherosclerosis (ICD10-170.0) 7. Cardiomegaly. 8. Coronary artery calcifications. 9. Small bilateral pleural effusions. 10. Anasarca. 11. The tip of the nasogastric tube is positioned in the first portion of the duodenum. Consider pulling back several cm for more optimal placement. 12. Well-positioned hemodialysis catheter with the tip at the superior cavoatrial junction. Electronically Signed   By: Malachy Moan M.D.   On: 02/02/2017 07:52   Ct Chest W Contrast  Result Date: 02/02/2017 CLINICAL DATA:  Abnormal chest CT with hematoma. EXAM: CT CHEST, ABDOMEN AND PELVIS WITHOUT CONTRAST TECHNIQUE: Multidetector CT imaging of the chest, abdomen and pelvis was performed following the standard protocol without IV contrast. COMPARISON:  Abdominal CT 02/02/2017 FINDINGS: CT CHEST FINDINGS Cardiovascular: Dialysis catheter from the right IJ, tip at the upper cavoatrial junction. Cardiomegaly without pericardial effusion. Diffuse atherosclerotic calcification, including the coronary arteries. No acute vascular finding Mediastinum/Nodes: Negative for adenopathy. Nasoenteric tube present. Lungs/Pleura: There is extensive airway debris within the right middle and lower lobes with collapse and consolidation. Patchy atelectatic and consolidated type opacities throughout the bilateral lungs. There are multiple rounded pulmonary nodules, largest seen in the left lower lobe measures 13 mm. Small layering pleural effusions without pleural thickening. No pneumothorax. Musculoskeletal: High-density pectoral mass on the right measuring up to 9 cm. This has a faint enhancing rim. Intramuscular location would not be typical for an implant and this is best attributed to hematoma. A sarcoma should be long standing and clinically known at this size. Hematoma was also suggested on outside chest CT 01/06/2017. Anasarca. CT ABDOMEN PELVIS FINDINGS Hepatobiliary: No acute hepatic finding.Cholecystectomy with normal common  bile  duct diameter. Pancreas: Unremarkable. Spleen: Unremarkable. Adrenals/Urinary Tract: Negative adrenals. No hydronephrosis or stone. Poor renal enhancement. Patient is on dialysis. Moderately distended urinary bladder. Stomach/Bowel: Negative for bowel obstruction or evidence of inflammation. Nasogastric tube with tip at the pylorus. Vascular/Lymphatic: Atherosclerotic calcification of the aorta and branches. No acute vascular finding. No mass or adenopathy. Reproductive:No significant finding Other: Anasarca. Diffuse retroperitoneal edema. No pneumoperitoneum. Musculoskeletal: Low-density expansion diffusely along the psoas, measuring up to 3.3 cm in diameter. There also 2 contiguous low-density faintly rim enhancing collections in the left posterior pararenal space, the larger 4 x 8 cm. The neighboring fat does not appear edematous and there is patchy high-density within the psoas collection. The lumbar spine is severely degenerated but no findings suspicious for discitis osteomyelitis. Spinal stenosis that is advanced at L3-4. IMPRESSION: Chest CT: 1. The 10 cm right chest wall mass is intramuscular and favors hematoma. Hematoma was also described on 01/06/2017 outside chest CT. 2. Extensive airway debris in the right middle and lower lobes with lobar collapse. 3. History of pneumonia with patchy opacities throughout the bilateral lungs. 4. Numerous pulmonary nodules, measuring up to 13 mm. These could be infectious or metastatic. Recommend chest CT in 6 weeks. 5. Small layering pleural effusions and anasarca.  Cardiomegaly. Abdominal CT: 1. Low-density expansion of the left psoas with contiguous left posterior pararenal collections measuring up to 4 x 8 cm. Neighboring fat is not inflamed and psoas collection has patchy internal high density, more consistent with hematoma than abscess. 2. Anasarca. Electronically Signed   By: Marnee SpringJonathon  Watts M.D.   On: 02/02/2017 19:12   Ct Abdomen Pelvis W Contrast  Result  Date: 02/02/2017 CLINICAL DATA:  Abnormal chest CT with hematoma. EXAM: CT CHEST, ABDOMEN AND PELVIS WITHOUT CONTRAST TECHNIQUE: Multidetector CT imaging of the chest, abdomen and pelvis was performed following the standard protocol without IV contrast. COMPARISON:  Abdominal CT 02/02/2017 FINDINGS: CT CHEST FINDINGS Cardiovascular: Dialysis catheter from the right IJ, tip at the upper cavoatrial junction. Cardiomegaly without pericardial effusion. Diffuse atherosclerotic calcification, including the coronary arteries. No acute vascular finding Mediastinum/Nodes: Negative for adenopathy. Nasoenteric tube present. Lungs/Pleura: There is extensive airway debris within the right middle and lower lobes with collapse and consolidation. Patchy atelectatic and consolidated type opacities throughout the bilateral lungs. There are multiple rounded pulmonary nodules, largest seen in the left lower lobe measures 13 mm. Small layering pleural effusions without pleural thickening. No pneumothorax. Musculoskeletal: High-density pectoral mass on the right measuring up to 9 cm. This has a faint enhancing rim. Intramuscular location would not be typical for an implant and this is best attributed to hematoma. A sarcoma should be long standing and clinically known at this size. Hematoma was also suggested on outside chest CT 01/06/2017. Anasarca. CT ABDOMEN PELVIS FINDINGS Hepatobiliary: No acute hepatic finding.Cholecystectomy with normal common bile duct diameter. Pancreas: Unremarkable. Spleen: Unremarkable. Adrenals/Urinary Tract: Negative adrenals. No hydronephrosis or stone. Poor renal enhancement. Patient is on dialysis. Moderately distended urinary bladder. Stomach/Bowel: Negative for bowel obstruction or evidence of inflammation. Nasogastric tube with tip at the pylorus. Vascular/Lymphatic: Atherosclerotic calcification of the aorta and branches. No acute vascular finding. No mass or adenopathy. Reproductive:No significant  finding Other: Anasarca. Diffuse retroperitoneal edema. No pneumoperitoneum. Musculoskeletal: Low-density expansion diffusely along the psoas, measuring up to 3.3 cm in diameter. There also 2 contiguous low-density faintly rim enhancing collections in the left posterior pararenal space, the larger 4 x 8 cm. The neighboring fat does not appear edematous and  there is patchy high-density within the psoas collection. The lumbar spine is severely degenerated but no findings suspicious for discitis osteomyelitis. Spinal stenosis that is advanced at L3-4. IMPRESSION: Chest CT: 1. The 10 cm right chest wall mass is intramuscular and favors hematoma. Hematoma was also described on 01/06/2017 outside chest CT. 2. Extensive airway debris in the right middle and lower lobes with lobar collapse. 3. History of pneumonia with patchy opacities throughout the bilateral lungs. 4. Numerous pulmonary nodules, measuring up to 13 mm. These could be infectious or metastatic. Recommend chest CT in 6 weeks. 5. Small layering pleural effusions and anasarca.  Cardiomegaly. Abdominal CT: 1. Low-density expansion of the left psoas with contiguous left posterior pararenal collections measuring up to 4 x 8 cm. Neighboring fat is not inflamed and psoas collection has patchy internal high density, more consistent with hematoma than abscess. 2. Anasarca. Electronically Signed   By: Marnee Spring M.D.   On: 02/02/2017 19:12     Medications:       Assessment/ Plan:  81 y.o. 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 Started on dialysis for volume overload and poor urine output. [facility]  1. Acute renal failure Baseline creatinine is unknown; BUN/Cr remain high - Patient to continue dialysis on M-W-F schedule for  now - bladder incontinence- UOP not recorded  2. Acute pulmonary edema, right lower lobe pneumonia - Ultrafiltration with HD as tolerated   3. Psoas hematoma - seen on CT - monitor Hgb     LOS: 0 [facility] 7/18/20189:56 AM  [facility] [place], [place] [phone]

## 2017-02-05 LAB — RENAL FUNCTION PANEL
ALBUMIN: 1.8 g/dL — AB (ref 3.5–5.0)
ANION GAP: 5 (ref 5–15)
BUN: 46 mg/dL — ABNORMAL HIGH (ref 6–20)
CALCIUM: 9 mg/dL (ref 8.9–10.3)
CO2: 27 mmol/L (ref 22–32)
CREATININE: 1.87 mg/dL — AB (ref 0.44–1.00)
Chloride: 100 mmol/L — ABNORMAL LOW (ref 101–111)
GFR, EST AFRICAN AMERICAN: 28 mL/min — AB (ref >60)
GFR, EST NON AFRICAN AMERICAN: 24 mL/min — AB (ref >60)
Glucose, Bld: 106 mg/dL — ABNORMAL HIGH (ref 65–99)
PHOSPHORUS: 1.3 mg/dL — AB (ref 2.5–4.6)
Potassium: 3.8 mmol/L (ref 3.5–5.1)
SODIUM: 132 mmol/L — AB (ref 135–145)

## 2017-02-05 LAB — CBC
HCT: 25.1 % — ABNORMAL LOW (ref 36.0–46.0)
HEMOGLOBIN: 8.1 g/dL — AB (ref 12.0–15.0)
MCH: 27.2 pg (ref 26.0–34.0)
MCHC: 32.3 g/dL (ref 30.0–36.0)
MCV: 84.2 fL (ref 78.0–100.0)
PLATELETS: 118 10*3/uL — AB (ref 150–400)
RBC: 2.98 MIL/uL — AB (ref 3.87–5.11)
RDW: 20.6 % — ABNORMAL HIGH (ref 11.5–15.5)
WBC: 6.9 10*3/uL (ref 4.0–10.5)

## 2017-02-05 NOTE — Consult Note (Signed)
Chief Complaint: Patient was seen in consultation today for percutaneous gastric tube placement at the request of Dr Ardeth Sportsman   Supervising Physician: Malachy Moan  Patient Status: [facility]  History of Present Illness: Veronica Mckinney is a 81 y.o. female   deconditioning for several days Found down in home - had fallen- early July 2018 Admitted with UTI and sepsis Started on dialysis- ** Pulm HTN DM; anemia; HTN; CHF and Afib  Dysphagia; malnutrition Unresponsive   Request for percutaneous gastric tube placement  Dr Archer Asa has reviewed imaging and approves procedure  CT 02/02/17: IMPRESSION: Chest CT: 1. The 10 cm right chest wall mass is intramuscular and favors hematoma. Hematoma was also described on 01/06/2017 outside chest CT. 2. Extensive airway debris in the right middle and lower lobes with lobar collapse. 3. History of pneumonia with patchy opacities throughout the bilateral lungs. 4. Numerous pulmonary nodules, measuring up to 13 mm. These could be infectious or metastatic. Recommend chest CT in 6 weeks. 5. Small layering pleural effusions and anasarca.  Cardiomegaly.  Abdominal CT: 1. Low-density expansion of the left psoas with contiguous left posterior pararenal collections measuring up to 4 x 8 cm. Neighboring fat is not inflamed and psoas collection has patchy internal high density, more consistent with hematoma than abscess. 2. Anasarca.   No past medical history on file.  No past surgical history on file.  Allergies: Patient has no allergy information on record.  Medications: Prior to Admission medications   Not on File     No family history on file.  Social History   Social History  . Marital status: N/A    Spouse name: N/A  . Number of children: N/A  . Years of education: N/A   Social History Main Topics  . Smoking status: Not on file  . Smokeless tobacco: Not on file  . Alcohol use Not on file  . Drug use:  Unknown  . Sexual activity: Not on file   Other Topics Concern  . Not on file   Social History Narrative  . No narrative on file    Review of Systems: A 12 point ROS discussed and pertinent positives are indicated in the HPI above.  All other systems are negative.  Review of Systems  Constitutional: Positive for activity change and appetite change. Negative for fever.  Respiratory: Negative for cough and shortness of breath.     Vital Signs: There were no vitals taken for this visit.  Physical Exam  Constitutional:  No response  Cardiovascular: Normal rate.   Pulmonary/Chest: Effort normal.  Abdominal: Soft.  Musculoskeletal:  Lying in chair - in dialysis  Neurological:  Follows no commands  Skin: Skin is warm.  Psychiatric:  Consent in chart from grandchild  Nursing note and vitals reviewed.   Mallampati Score:  MD Evaluation Airway: WNL Airway comments: vent Heart: WNL Abdomen: WNL Chest/ Lungs: WNL Chest/ lungs comments: vent ASA  Classification: 3 Mallampati/Airway Score:  (difficult to assess---unable to follow commands)  Imaging: Ct Abdomen Wo Contrast  Result Date: 02/02/2017 CLINICAL DATA:  81 year old female with protein calorie malnutrition. Evaluate anatomy for percutaneous gastrostomy tube placement. EXAM: CT ABDOMEN WITHOUT CONTRAST TECHNIQUE: Multidetector CT imaging of the abdomen was performed following the standard protocol without IV contrast. COMPARISON:  Abdominal radiograph 01/19/2017; chest x-ray 01/20/2017 FINDINGS: Lower chest: Respiratory motion limits evaluation of the visualized lower lungs. However, there is a 1.5 cm nodular opacity in the left upper lobe with additional patchy  and somewhat nodular opacities in the left lower lobe. The bronchial wall thickening and bronchiectasis is present in the visualized portion of the right lung with partial atelectasis and/ or scarring in the right middle lobe and lingula. A few small nodular  opacities are also present in the right lower lobe. Small bilateral pleural effusions. Cardiomegaly. Extensive calcifications along the course of the coronary arteries. A tunneled hemodialysis catheter is present. The tip terminates at the superior cavoatrial junction. A nasogastric tube is present. The tip terminates in the first portion of the duodenum. Hepatobiliary: The gallbladder is surgically absent. Mild intra and extrahepatic biliary duct dilatation without definite mass or stone. The liver parenchyma is high in attenuation. Pancreas: Atrophic.  No definite mass. Spleen: Normal in size without focal abnormality. Adrenals/Urinary Tract: No focal adrenal mass. No hydronephrosis or nephrolithiasis. Stomach/Bowel: No evidence of bowel obstruction or focal bowel wall thickening in the visualized portions. Normal gastric anatomy. No evidence of bypass surgery or other abnormality. Vascular/Lymphatic: Limited evaluation in the absence of intravenous contrast. Calcifications present throughout the abdominal aorta. No aneurysm visualized. Other: Extensive skin thickening and interstitial stranding throughout the subcutaneous fat consistent with anasarca. Musculoskeletal: Incompletely imaged 8.8 x 4.4 cm high attenuation soft tissue mass in the subcutaneous fat of the right upper chest. This abnormality lies above the breast. The contralateral side is unremarkable. Additionally, there is asymmetry in the psoas muscles with relative high attenuation and expansion on the left psoas muscle concerning for retroperitoneal hematoma. Additionally, there are what appear to be fluid collections in the retroperitoneal fat posterior to the left psoas muscle. IMPRESSION: 1. Incompletely imaged multifocal nodular opacities throughout the lungs the largest of which measures 1.5 cm in the left upper lobe. Differential considerations include multifocal pneumonia, and metastatic disease. If the patient does not clearly have  multifocal pneumonia, then further evaluation with a dedicated chest CT is recommended. If the patient does have clinical symptoms of a pneumonia, then a follow-up CT scan of the chest is recommended in 4- 6 weeks following an appropriate course of therapy to document resolution of the nodular opacities. 2. Intermediate attenuation density in the subcutaneous fat of the right upper chest superior to the breast is favored to represent a subcutaneous hematoma. An incompletely imaged and superiorly displaced breast prostheses or other soft tissue mass is possible but considered less likely given the density in the absence of associated calcifications. This finding could also be further evaluated by CT scan of the chest. 3. Incompletely imaged but suspected left psoas hematoma versus abscess with adjacent retroperitoneal fluid collection. Consider further evaluation with a CT scan of the abdomen and pelvis with intravenous contrast. 4. Normal gastric anatomy for percutaneous gastrostomy tube placement. 5. Mild intra and extrahepatic biliary ductal dilatation is favored to be secondary to patient age and post cholecystectomy status. Recommend clinical correlation with serum LFTs and bilirubin to evaluate for evidence of obstruction. 6.  Aortic Atherosclerosis (ICD10-170.0) 7. Cardiomegaly. 8. Coronary artery calcifications. 9. Small bilateral pleural effusions. 10. Anasarca. 11. The tip of the nasogastric tube is positioned in the first portion of the duodenum. Consider pulling back several cm for more optimal placement. 12. Well-positioned hemodialysis catheter with the tip at the superior cavoatrial junction. Electronically Signed   By: Malachy Moan M.D.   On: 02/02/2017 07:52   Ct Chest W Contrast  Result Date: 02/02/2017 CLINICAL DATA:  Abnormal chest CT with hematoma. EXAM: CT CHEST, ABDOMEN AND PELVIS WITHOUT CONTRAST TECHNIQUE: Multidetector CT imaging of  the chest, abdomen and pelvis was performed  following the standard protocol without IV contrast. COMPARISON:  Abdominal CT 02/02/2017 FINDINGS: CT CHEST FINDINGS Cardiovascular: Dialysis catheter from the right IJ, tip at the upper cavoatrial junction. Cardiomegaly without pericardial effusion. Diffuse atherosclerotic calcification, including the coronary arteries. No acute vascular finding Mediastinum/Nodes: Negative for adenopathy. Nasoenteric tube present. Lungs/Pleura: There is extensive airway debris within the right middle and lower lobes with collapse and consolidation. Patchy atelectatic and consolidated type opacities throughout the bilateral lungs. There are multiple rounded pulmonary nodules, largest seen in the left lower lobe measures 13 mm. Small layering pleural effusions without pleural thickening. No pneumothorax. Musculoskeletal: High-density pectoral mass on the right measuring up to 9 cm. This has a faint enhancing rim. Intramuscular location would not be typical for an implant and this is best attributed to hematoma. A sarcoma should be long standing and clinically known at this size. Hematoma was also suggested on outside chest CT 01/06/2017. Anasarca. CT ABDOMEN PELVIS FINDINGS Hepatobiliary: No acute hepatic finding.Cholecystectomy with normal common bile duct diameter. Pancreas: Unremarkable. Spleen: Unremarkable. Adrenals/Urinary Tract: Negative adrenals. No hydronephrosis or stone. Poor renal enhancement. Patient is on dialysis. Moderately distended urinary bladder. Stomach/Bowel: Negative for bowel obstruction or evidence of inflammation. Nasogastric tube with tip at the pylorus. Vascular/Lymphatic: Atherosclerotic calcification of the aorta and branches. No acute vascular finding. No mass or adenopathy. Reproductive:No significant finding Other: Anasarca. Diffuse retroperitoneal edema. No pneumoperitoneum. Musculoskeletal: Low-density expansion diffusely along the psoas, measuring up to 3.3 cm in diameter. There also 2 contiguous  low-density faintly rim enhancing collections in the left posterior pararenal space, the larger 4 x 8 cm. The neighboring fat does not appear edematous and there is patchy high-density within the psoas collection. The lumbar spine is severely degenerated but no findings suspicious for discitis osteomyelitis. Spinal stenosis that is advanced at L3-4. IMPRESSION: Chest CT: 1. The 10 cm right chest wall mass is intramuscular and favors hematoma. Hematoma was also described on 01/06/2017 outside chest CT. 2. Extensive airway debris in the right middle and lower lobes with lobar collapse. 3. History of pneumonia with patchy opacities throughout the bilateral lungs. 4. Numerous pulmonary nodules, measuring up to 13 mm. These could be infectious or metastatic. Recommend chest CT in 6 weeks. 5. Small layering pleural effusions and anasarca.  Cardiomegaly. Abdominal CT: 1. Low-density expansion of the left psoas with contiguous left posterior pararenal collections measuring up to 4 x 8 cm. Neighboring fat is not inflamed and psoas collection has patchy internal high density, more consistent with hematoma than abscess. 2. Anasarca. Electronically Signed   By: Marnee SpringJonathon  Watts M.D.   On: 02/02/2017 19:12   Ct Abdomen Pelvis W Contrast  Result Date: 02/02/2017 CLINICAL DATA:  Abnormal chest CT with hematoma. EXAM: CT CHEST, ABDOMEN AND PELVIS WITHOUT CONTRAST TECHNIQUE: Multidetector CT imaging of the chest, abdomen and pelvis was performed following the standard protocol without IV contrast. COMPARISON:  Abdominal CT 02/02/2017 FINDINGS: CT CHEST FINDINGS Cardiovascular: Dialysis catheter from the right IJ, tip at the upper cavoatrial junction. Cardiomegaly without pericardial effusion. Diffuse atherosclerotic calcification, including the coronary arteries. No acute vascular finding Mediastinum/Nodes: Negative for adenopathy. Nasoenteric tube present. Lungs/Pleura: There is extensive airway debris within the right middle  and lower lobes with collapse and consolidation. Patchy atelectatic and consolidated type opacities throughout the bilateral lungs. There are multiple rounded pulmonary nodules, largest seen in the left lower lobe measures 13 mm. Small layering pleural effusions without pleural thickening. No pneumothorax. Musculoskeletal:  High-density pectoral mass on the right measuring up to 9 cm. This has a faint enhancing rim. Intramuscular location would not be typical for an implant and this is best attributed to hematoma. A sarcoma should be long standing and clinically known at this size. Hematoma was also suggested on outside chest CT 01/06/2017. Anasarca. CT ABDOMEN PELVIS FINDINGS Hepatobiliary: No acute hepatic finding.Cholecystectomy with normal common bile duct diameter. Pancreas: Unremarkable. Spleen: Unremarkable. Adrenals/Urinary Tract: Negative adrenals. No hydronephrosis or stone. Poor renal enhancement. Patient is on dialysis. Moderately distended urinary bladder. Stomach/Bowel: Negative for bowel obstruction or evidence of inflammation. Nasogastric tube with tip at the pylorus. Vascular/Lymphatic: Atherosclerotic calcification of the aorta and branches. No acute vascular finding. No mass or adenopathy. Reproductive:No significant finding Other: Anasarca. Diffuse retroperitoneal edema. No pneumoperitoneum. Musculoskeletal: Low-density expansion diffusely along the psoas, measuring up to 3.3 cm in diameter. There also 2 contiguous low-density faintly rim enhancing collections in the left posterior pararenal space, the larger 4 x 8 cm. The neighboring fat does not appear edematous and there is patchy high-density within the psoas collection. The lumbar spine is severely degenerated but no findings suspicious for discitis osteomyelitis. Spinal stenosis that is advanced at L3-4. IMPRESSION: Chest CT: 1. The 10 cm right chest wall mass is intramuscular and favors hematoma. Hematoma was also described on 01/06/2017  outside chest CT. 2. Extensive airway debris in the right middle and lower lobes with lobar collapse. 3. History of pneumonia with patchy opacities throughout the bilateral lungs. 4. Numerous pulmonary nodules, measuring up to 13 mm. These could be infectious or metastatic. Recommend chest CT in 6 weeks. 5. Small layering pleural effusions and anasarca.  Cardiomegaly. Abdominal CT: 1. Low-density expansion of the left psoas with contiguous left posterior pararenal collections measuring up to 4 x 8 cm. Neighboring fat is not inflamed and psoas collection has patchy internal high density, more consistent with hematoma than abscess. 2. Anasarca. Electronically Signed   By: Marnee Spring M.D.   On: 02/02/2017 19:12   Dg Chest Port 1 View  Result Date: 01/20/2017 CLINICAL DATA:  Respiratory failure. EXAM: PORTABLE CHEST 1 VIEW COMPARISON:  None. FINDINGS: Nasogastric tube enters the abdomen. Right internal jugular central line tips at the SVC RA junction or proximal right atrium. The patient is rotated markedly towards the right. There is probably pulmonary edema. There is a right effusion with right lower lobe collapse. Abnormal density in the left lower lobe could represent pneumonia. IMPRESSION: Markedly rotated film. Right effusion with right lower lobe collapse. Possible left lower lobe pneumonia. Suspicion of pulmonary edema/ fluid overload. Electronically Signed   By: Paulina Fusi M.D.   On: 01/20/2017 07:48   Dg Abd Portable 1v  Result Date: 01/19/2017 CLINICAL DATA:  81 y/o  F; nasogastric tube placement. EXAM: PORTABLE ABDOMEN - 1 VIEW COMPARISON:  None. FINDINGS: Normal bowel gas pattern. Enteric tube tip projects over right lower quadrant likely in distal stomach. Right upper quadrant surgical clips from cholecystectomy. Ill-defined patchy opacifications of the lungs and small bilateral pleural effusions. Mildly enlarged cardiac silhouette. No radiopaque urinary stone disease identified. IMPRESSION:  Enteric tube tip likely in distal stomach. Normal bowel gas pattern. Electronically Signed   By: Mitzi Hansen M.D.   On: 01/19/2017 04:47    Labs:  CBC:  Recent Labs  01/29/17 0455 02/01/17 0659 02/03/17 0606 02/05/17 0601  WBC 8.1 5.6 7.3 6.9  HGB 7.8* 8.1* 8.7* 8.1*  HCT 24.1* 25.6* 26.8* 25.1*  PLT 103* 89* 100*  118*    COAGS:  Recent Labs  01/19/17 0652 02/01/17 0659  INR 1.22 1.08    BMP:  Recent Labs  01/29/17 0455 02/01/17 0659 02/03/17 0606 02/05/17 0601  NA 131* 128* 128* 132*  K 3.9 4.2 4.3 3.8  CL 99* 94* 96* 100*  CO2 26 28 26 27   GLUCOSE 65 99 146* 106*  BUN 61* 89* 45* 46*  CALCIUM 8.4* 8.9 8.6* 9.0  CREATININE 1.97* 2.48* 1.92* 1.87*  GFRNONAA 23* 17* 24* 24*  GFRAA 27* 20* 27* 28*    LIVER FUNCTION TESTS:  Recent Labs  01/29/17 0455 02/01/17 0659 02/03/17 0606 02/05/17 0601  ALBUMIN 1.6* 1.6* 1.7* 1.8*    TUMOR MARKERS: No results for input(s): AFPTM, CEA, CA199, CHROMGRNA in the last 8760 hours.  Assessment and Plan:  Malnutrition Dysphagia Deconditioning Scheduled for percutaneous gastric tube placement Plan for Next week Risks and Benefits discussed with the patient's family including, but not limited to the need for a barium enema during the procedure, bleeding, infection, peritonitis, or damage to adjacent structures. All of family questions were answered, they agreeable to proceed. Consent signed and in chart.   Thank you for this interesting consult.  I greatly enjoyed meeting Odell Fasching and look forward to participating in their care.  A copy of this report was sent to the requesting provider on this date.  Electronically Signed: Robet Leu, PA-C 02/05/2017, 2:54 PM   I spent a total of 40 Minutes    in face to face in clinical consultation, greater than 50% of which was counseling/coordinating care for percutaneous gastric tube placement

## 2017-02-05 NOTE — Progress Notes (Signed)
  02/05/17  Subjective:  Patient continues to be lethargic. Not following commands. Eyes are open. Patient is incontinent. Urine output not recorded Rectal tube. Stool output 300 cc 1100 cc of fluid was removed with dialysis earlier today.     Objective:  Vital signs in last 24 hours:  Temperature 97.5, respirations 16, pulse 67, blood pressure 146/89 postdialysis  Physical Exam: General: NAD   HEENT [place]/AT   Neck supple  Pulm/lungs Coarse b/l, Nasal cannula oxygen   CVS/Heart Irregular rhythm   Abdomen:  Soft NT,   Extremities: + dependent edema  Neurologic: Lethargic, not following commands  Skin: Scattered ecchymoses  Access: Rt IJ PC       Basic Metabolic Panel:   Recent Labs Lab 02/01/17 0659 02/03/17 0606 02/05/17 0601  NA 128* 128* 132*  K 4.2 4.3 3.8  CL 94* 96* 100*  CO2 28 26 27   GLUCOSE 99 146* 106*  BUN 89* 45* 46*  CREATININE 2.48* 1.92* 1.87*  CALCIUM 8.9 8.6* 9.0  PHOS 2.8 1.9* 1.3*     CBC:  Recent Labs Lab 02/01/17 0659 02/03/17 0606 02/05/17 0601  WBC 5.6 7.3 6.9  HGB 8.1* 8.7* 8.1*  HCT 25.6* 26.8* 25.1*  MCV 84.2 83.8 84.2  PLT 89* 100* 118*      Lab Results  Component Value Date   HEPBSAG Negative 01/20/2017   HEPBSAB Non Reactive 01/20/2017   HEPBIGM Negative 01/20/2017      Microbiology:  No results found for this or any previous visit (from the past 240 hour(s)).  Coagulation Studies: No results for input(s): LABPROT, INR in the last 72 hours.  Urinalysis: No results for input(s): COLORURINE, LABSPEC, PHURINE, GLUCOSEU, HGBUR, BILIRUBINUR, KETONESUR, PROTEINUR, UROBILINOGEN, NITRITE, LEUKOCYTESUR in the last 72 hours.  Invalid input(s): APPERANCEUR    Imaging: No results found.   Medications:       Assessment/ Plan:  81 y.o. female with diabetes, atrial fibrillation, hypertension, congestive heart failure, pulmonary hypertension, diastolic dysfunction.She was treated for sepsis from pneumonia and  UTI, peri anal HSV at the outside hospital. Started on dialysis for volume overload and poor urine output. [facility]  1. Acute renal failure Baseline creatinine is unknown; BUN/Cr remain high - Patient to continue dialysis on M-W-F schedule for now - bladder incontinence- UOP not recorded  2. Acute pulmonary edema, right lower lobe pneumonia - Ultrafiltration with HD as tolerated   3. Psoas hematoma - seen on CT - monitor Hgb  - Hemoglobin low at 8.1. HIT antibody test negative   LOS: 0 [facility] HospitalINGH,Jo-Ann Johanning 7/20/20185:09 PM  [facility] [place], [place] [phone]

## 2017-02-08 LAB — CBC
HEMATOCRIT: 25.7 % — AB (ref 36.0–46.0)
Hemoglobin: 8 g/dL — ABNORMAL LOW (ref 12.0–15.0)
MCH: 26.7 pg (ref 26.0–34.0)
MCHC: 31.1 g/dL (ref 30.0–36.0)
MCV: 85.7 fL (ref 78.0–100.0)
PLATELETS: 123 10*3/uL — AB (ref 150–400)
RBC: 3 MIL/uL — ABNORMAL LOW (ref 3.87–5.11)
RDW: 21.3 % — AB (ref 11.5–15.5)
WBC: 6.2 10*3/uL (ref 4.0–10.5)

## 2017-02-08 LAB — RENAL FUNCTION PANEL
ANION GAP: 4 — AB (ref 5–15)
Albumin: 1.8 g/dL — ABNORMAL LOW (ref 3.5–5.0)
BUN: 47 mg/dL — ABNORMAL HIGH (ref 6–20)
CHLORIDE: 97 mmol/L — AB (ref 101–111)
CO2: 30 mmol/L (ref 22–32)
CREATININE: 2.11 mg/dL — AB (ref 0.44–1.00)
Calcium: 9.3 mg/dL (ref 8.9–10.3)
GFR calc non Af Amer: 21 mL/min — ABNORMAL LOW (ref >60)
GFR, EST AFRICAN AMERICAN: 25 mL/min — AB (ref >60)
GLUCOSE: 107 mg/dL — AB (ref 65–99)
Phosphorus: 1.8 mg/dL — ABNORMAL LOW (ref 2.5–4.6)
Potassium: 4.1 mmol/L (ref 3.5–5.1)
SODIUM: 131 mmol/L — AB (ref 135–145)

## 2017-02-08 LAB — PROTIME-INR
INR: 1.2
Prothrombin Time: 15.3 seconds — ABNORMAL HIGH (ref 11.4–15.2)

## 2017-02-08 NOTE — Progress Notes (Signed)
  02/08/17  Subjective:  Patient seen and evaluated during hemodialysis. Appears to be tolerating well at the moment.   Objective:  Vital signs in last 24 hours:  Temperature 97.8 pulse 55 respirations 16 blood pressure 148/57  Physical Exam: General: NAD   HEENT [place]/AT   Neck supple  Pulm/lungs Coarse b/l, Nasal cannula oxygen   CVS/Heart Irregular rhythm no rubs  Abdomen:  Soft NTND  Extremities: + dependent edema  Neurologic: Lethargic, not following commands  Skin: Scattered ecchymoses  Access: Rt IJ PC       Basic Metabolic Panel:   Recent Labs Lab 02/03/17 0606 02/05/17 0601 02/08/17 0554  NA 128* 132* 131*  K 4.3 3.8 4.1  CL 96* 100* 97*  CO2 26 27 30   GLUCOSE 146* 106* 107*  BUN 45* 46* 47*  CREATININE 1.92* 1.87* 2.11*  CALCIUM 8.6* 9.0 9.3  PHOS 1.9* 1.3* 1.8*     CBC:  Recent Labs Lab 02/03/17 0606 02/05/17 0601 02/08/17 0554  WBC 7.3 6.9 6.2  HGB 8.7* 8.1* 8.0*  HCT 26.8* 25.1* 25.7*  MCV 83.8 84.2 85.7  PLT 100* 118* 123*      Lab Results  Component Value Date   HEPBSAG Negative 01/20/2017   HEPBSAB Non Reactive 01/20/2017   HEPBIGM Negative 01/20/2017      Microbiology:  No results found for this or any previous visit (from the past 240 hour(s)).  Coagulation Studies:  Recent Labs  02/08/17 0554  LABPROT 15.3*  INR 1.20    Urinalysis: No results for input(s): COLORURINE, LABSPEC, PHURINE, GLUCOSEU, HGBUR, BILIRUBINUR, KETONESUR, PROTEINUR, UROBILINOGEN, NITRITE, LEUKOCYTESUR in the last 72 hours.  Invalid input(s): APPERANCEUR    Imaging: No results found.   Medications:       Assessment/ Plan:  81 y.o. 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 Started on dialysis for volume overload and poor urine output. [facility]  1. Acute renal failure Baseline creatinine is unknown; BUN/Cr remain high - On predialysis labs BUN was 47 with a creatinine 2.1. Patient seen and evaluated during hemodialysis. We will plan for dialysis again on Wednesday.  2. Acute pulmonary edema, right lower lobe pneumonia - Continue ultrafiltration with hemodialysis.   3. Anemia unspecified. Hemoglobin currently 8.0. Continue to monitor CBC given recent psoas hematoma.  4. Hyponatremia. Likely attributable to acute renal failure. Continue to monitor serum sodium. Should correct partially with dialysis.   LOS: 0 Mady HaagensenLATEEF, Trish Mancinelli 7/23/20183:15 PM  [facility] [place], [place] [phone]

## 2017-02-09 ENCOUNTER — Encounter (HOSPITAL_COMMUNITY): Payer: Self-pay | Admitting: Interventional Radiology

## 2017-02-09 ENCOUNTER — Other Ambulatory Visit (HOSPITAL_COMMUNITY): Payer: Medicare Other

## 2017-02-09 HISTORY — PX: IR GASTROSTOMY TUBE MOD SED: IMG625

## 2017-02-09 LAB — RENAL FUNCTION PANEL
ANION GAP: 7 (ref 5–15)
Albumin: 1.7 g/dL — ABNORMAL LOW (ref 3.5–5.0)
BUN: 25 mg/dL — ABNORMAL HIGH (ref 6–20)
CO2: 27 mmol/L (ref 22–32)
Calcium: 8.5 mg/dL — ABNORMAL LOW (ref 8.9–10.3)
Chloride: 95 mmol/L — ABNORMAL LOW (ref 101–111)
Creatinine, Ser: 1.42 mg/dL — ABNORMAL HIGH (ref 0.44–1.00)
GFR calc Af Amer: 40 mL/min — ABNORMAL LOW (ref >60)
GFR calc non Af Amer: 34 mL/min — ABNORMAL LOW (ref >60)
GLUCOSE: 157 mg/dL — AB (ref 65–99)
PHOSPHORUS: 1.2 mg/dL — AB (ref 2.5–4.6)
POTASSIUM: 3.8 mmol/L (ref 3.5–5.1)
Sodium: 129 mmol/L — ABNORMAL LOW (ref 135–145)

## 2017-02-09 LAB — MAGNESIUM: Magnesium: 1.9 mg/dL (ref 1.7–2.4)

## 2017-02-09 MED ORDER — CEFAZOLIN SODIUM-DEXTROSE 2-4 GM/100ML-% IV SOLN
INTRAVENOUS | Status: AC
  Filled 2017-02-09: qty 100

## 2017-02-09 MED ORDER — GLUCAGON HCL RDNA (DIAGNOSTIC) 1 MG IJ SOLR
INTRAMUSCULAR | Status: AC
  Filled 2017-02-09: qty 1

## 2017-02-09 MED ORDER — IOPAMIDOL (ISOVUE-300) INJECTION 61%
INTRAVENOUS | Status: AC
  Administered 2017-02-09: 10 mL
  Filled 2017-02-09: qty 50

## 2017-02-09 MED ORDER — LIDOCAINE HCL (PF) 1 % IJ SOLN
INTRAMUSCULAR | Status: AC | PRN
  Administered 2017-02-09: 10 mL

## 2017-02-09 MED ORDER — MIDAZOLAM HCL 2 MG/2ML IJ SOLN
INTRAMUSCULAR | Status: AC
  Filled 2017-02-09: qty 2

## 2017-02-09 MED ORDER — FENTANYL CITRATE (PF) 100 MCG/2ML IJ SOLN
INTRAMUSCULAR | Status: AC
  Filled 2017-02-09: qty 2

## 2017-02-09 MED ORDER — FENTANYL CITRATE (PF) 100 MCG/2ML IJ SOLN
INTRAMUSCULAR | Status: AC | PRN
  Administered 2017-02-09 (×2): 12.5 ug via INTRAVENOUS

## 2017-02-09 MED ORDER — LIDOCAINE HCL (PF) 1 % IJ SOLN
INTRAMUSCULAR | Status: AC
  Filled 2017-02-09: qty 30

## 2017-02-09 MED ORDER — MIDAZOLAM HCL 2 MG/2ML IJ SOLN
INTRAMUSCULAR | Status: AC | PRN
  Administered 2017-02-09: 1 mg via INTRAVENOUS

## 2017-02-09 NOTE — Procedures (Signed)
Interventional Radiology Procedure Note  Procedure: Placement of percutaneous 20F pull-through gastrostomy tube. Complications: None Recommendations: - NPO except for sips and chips remainder of today and overnight - Maintain G-tube to LWS until tomorrow morning  - May advance diet as tolerated and begin using tube tomorrow morning  Signed,   Kyllian Clingerman S. Sylvester Salonga, DO   

## 2017-02-09 NOTE — Sedation Documentation (Signed)
Patient is resting comfortably. 

## 2017-02-09 NOTE — Sedation Documentation (Signed)
2Gm IV Ancef given per protocol

## 2017-02-10 LAB — RENAL FUNCTION PANEL
ANION GAP: 8 (ref 5–15)
Albumin: 1.8 g/dL — ABNORMAL LOW (ref 3.5–5.0)
BUN: 31 mg/dL — ABNORMAL HIGH (ref 6–20)
CHLORIDE: 98 mmol/L — AB (ref 101–111)
CO2: 25 mmol/L (ref 22–32)
Calcium: 8.9 mg/dL (ref 8.9–10.3)
Creatinine, Ser: 1.85 mg/dL — ABNORMAL HIGH (ref 0.44–1.00)
GFR, EST AFRICAN AMERICAN: 29 mL/min — AB (ref >60)
GFR, EST NON AFRICAN AMERICAN: 25 mL/min — AB (ref >60)
Glucose, Bld: 107 mg/dL — ABNORMAL HIGH (ref 65–99)
POTASSIUM: 4.2 mmol/L (ref 3.5–5.1)
Phosphorus: 2.2 mg/dL — ABNORMAL LOW (ref 2.5–4.6)
Sodium: 131 mmol/L — ABNORMAL LOW (ref 135–145)

## 2017-02-10 LAB — CBC
HEMATOCRIT: 29 % — AB (ref 36.0–46.0)
HEMOGLOBIN: 9.1 g/dL — AB (ref 12.0–15.0)
MCH: 27.1 pg (ref 26.0–34.0)
MCHC: 31.4 g/dL (ref 30.0–36.0)
MCV: 86.3 fL (ref 78.0–100.0)
Platelets: 173 10*3/uL (ref 150–400)
RBC: 3.36 MIL/uL — AB (ref 3.87–5.11)
RDW: 21.1 % — ABNORMAL HIGH (ref 11.5–15.5)
WBC: 8.3 10*3/uL (ref 4.0–10.5)

## 2017-02-10 NOTE — Progress Notes (Signed)
Referring Physician(s): Dr. Sharyon MedicusHijazi  Supervising Physician: Oley BalmHassell, Daniel  [facility]  [facility]  Chief Complaint:   Dysphagia, long-term care  Subjective: Open eyes to voice, non verbal.  Appears comfortable. TFs infusing.   Allergies: Patient has no known allergies.  Medications: Prior to Admission medications   Not on File     Vital Signs: BP 139/62   Pulse (!) 54   Resp 18   SpO2 97% Comment: 2L  Physical Exam  Awakens easily, NAD Abd:  Gastrostomy in place.  Insertion site with old blood, no active oozing. No erythema or warmth. TFs currently infusion.  Imaging: Ir Gastrostomy Tube Mod Sed  Result Date: 02/09/2017 INDICATION: 81 year old female with dysphagia EXAM: PERC PLACEMENT GASTROSTOMY MEDICATIONS: 2.0 g Ancef; Antibiotics were administered within 1 hour of the procedure. ANESTHESIA/SEDATION: Versed 2.0 mg IV; Fentanyl 25 mcg IV Moderate Sedation Time:  25 minutes The patient was continuously monitored during the procedure by the interventional radiology nurse under my direct supervision. CONTRAST:  10 cc - administered into the gastric lumen. FLUOROSCOPY TIME:  Fluoroscopy Time: 6 minutes 24 seconds (25 mGy). COMPLICATIONS: None PROCEDURE: Informed written consent was obtained from the patient's family after a thorough discussion of the procedural risks, benefits and alternatives. All questions were addressed. Maximal Sterile Barrier Technique was utilized including caps, mask, sterile gowns, sterile gloves, sterile drape, hand hygiene and skin antiseptic. A timeout was performed prior to the initiation of the procedure. The procedure, risks, benefits, and alternatives were explained to the patient. Questions regarding the procedure were encouraged and answered. The patient understands and consents to the procedure. The epigastrium was prepped with Betadine in a sterile fashion, and a sterile drape was applied covering the operative field. A sterile gown and  sterile gloves were used for the procedure. A 5-French orogastric tube is placed under fluoroscopic guidance. Scout imaging of the abdomen confirms barium within the transverse colon. The stomach was distended with gas. Under fluoroscopic guidance, an 18 gauge needle was utilized to puncture the anterior wall of the body of the stomach. An Amplatz wire was advanced through the needle passing a T fastener into the lumen of the stomach. The T fastener was secured for gastropexy. A 9-French sheath was inserted. A snare was advanced through the 9-French sheath. A Teena DunkBenson was advanced through the orogastric tube. It was snared then pulled out the oral cavity, pulling the snare, as well. Upon withdrawing the Bentson wire and catheter combination, the snare was dislodged in the esophagus. A Glidewire was then navigated through the sheath into the esophagus. Glidewire would not pass through the length of the thoracic esophagus. A 4 French straight glide cath was then placed over the Glidewire, into the esophagus. Glidewire was removed and a stiff Glidewire is placed. This was successful in navigating through the oral cavity. Glide cath was then passed through the oral cavity exiting the mouth. Glidewire was removed and a Bentson wire was passed through and through. Catheter was removed, and then the snare was attached to the caudal end of the Bentson wire which was then pulled through the oral cavity. The leading edge of the gastrostomy was then attached to the snare. It was then pulled down the esophagus and out the percutaneous site. It was secured in place. Contrast was injected. No complication IMPRESSION: Status post fluoroscopic placed percutaneous gastrostomy tube, with 20 [place] pull-through. Signed, Yvone NeuJaime S. Loreta AveWagner, DO [facility] Electronically Signed   By:  Gilmer MorJaime  Wagner D.O.   On: 02/09/2017 15:55    Labs:  CBC:  Recent Labs  02/03/17 0606  02/05/17 0601 02/08/17 0554 02/10/17 0633  WBC 7.3 6.9 6.2 8.3  HGB 8.7* 8.1* 8.0* 9.1*  HCT 26.8* 25.1* 25.7* 29.0*  PLT 100* 118* 123* 173    COAGS:  Recent Labs  01/19/17 0652 02/01/17 0659 02/08/17 0554  INR 1.22 1.08 1.20    BMP:  Recent Labs  02/05/17 0601 02/08/17 0554 02/09/17 0533 02/10/17 0633  NA 132* 131* 129* 131*  K 3.8 4.1 3.8 4.2  CL 100* 97* 95* 98*  CO2 27 30 27 25   GLUCOSE 106* 107* 157* 107*  BUN 46* 47* 25* 31*  CALCIUM 9.0 9.3 8.5* 8.9  CREATININE 1.87* 2.11* 1.42* 1.85*  GFRNONAA 24* 21* 34* 25*  GFRAA 28* 25* 40* 29*    LIVER FUNCTION TESTS:  Recent Labs  02/05/17 0601 02/08/17 0554 02/09/17 0533 02/10/17 0633  ALBUMIN 1.8* 1.8* 1.7* 1.8*    Assessment and Plan: Dysphagia, long-term care Patient s/p gastrostomy placement 02/09/17.  Site checked; old blood, but no active bleeding or irritation to site.  TF currently infusion- ok to use.  IR available if needed.   Electronically Signed: Hoyt KochKacie Sue-Ellen Shameka Aggarwal, PA 02/10/2017, 5:06 PM   I spent a total of 15 Minutes at the the patient's bedside AND on the patient's hospital floor or unit, greater than 50% of which was counseling/coordinating care for dysphagia

## 2017-02-10 NOTE — Progress Notes (Signed)
02/10/17  Subjective:  Patient seen at bedside. She remains lethargic but is arousable. Does not follow commands however. PEG tube has been placed.   Objective:  Vital signs in last 24 hours:  Computer 97.5 pulse 65 respiratory for blood pressure 122/63  Physical Exam: General: NAD   HEENT [place]/AT Oral mucosa moist   Neck supple  Pulm/lungs Coarse b/l, Nasal cannula oxygen   CVS/Heart Irregular rhythm no rubs  Abdomen:  Soft NTND PEG in place  Extremities: + dependent edema  Neurologic: Lethargic, but arousable, not following commands  Skin: Scattered ecchymoses  Access: Rt IJ PC       Basic Metabolic Panel:   Recent Labs Lab 02/05/17 0601 02/08/17 0554 02/09/17 0533 02/10/17 0633  NA 132* 131* 129* 131*  K 3.8 4.1 3.8 4.2  CL 100* 97* 95* 98*  CO2 27 30 27 25   GLUCOSE 106* 107* 157* 107*  BUN 46* 47* 25* 31*  CREATININE 1.87* 2.11* 1.42* 1.85*  CALCIUM 9.0 9.3 8.5* 8.9  MG  --   --  1.9  --   PHOS 1.3* 1.8* 1.2* 2.2*     CBC:  Recent Labs Lab 02/05/17 0601 02/08/17 0554 02/10/17 0633  WBC 6.9 6.2 8.3  HGB 8.1* 8.0* 9.1*  HCT 25.1* 25.7* 29.0*  MCV 84.2 85.7 86.3  PLT 118* 123* 173      Lab Results  Component Value Date   HEPBSAG Negative 01/20/2017   HEPBSAB Non Reactive 01/20/2017   HEPBIGM Negative 01/20/2017      Microbiology:  No results found for this or any previous visit (from the past 240 hour(s)).  Coagulation Studies:  Recent Labs  02/08/17 0554  LABPROT 15.3*  INR 1.20    Urinalysis: No results for input(s): COLORURINE, LABSPEC, PHURINE, GLUCOSEU, HGBUR, BILIRUBINUR, KETONESUR, PROTEINUR, UROBILINOGEN, NITRITE, LEUKOCYTESUR in the last 72 hours.  Invalid input(s): APPERANCEUR    Imaging: Ir Gastrostomy Tube Mod Sed  Result Date: 02/09/2017 INDICATION: 81 year old female with dysphagia EXAM: PERC PLACEMENT GASTROSTOMY MEDICATIONS: 2.0 g Ancef; Antibiotics were administered within 1 hour of the procedure.  ANESTHESIA/SEDATION: Versed 2.0 mg IV; Fentanyl 25 mcg IV Moderate Sedation Time:  25 minutes The patient was continuously monitored during the procedure by the interventional radiology nurse under my direct supervision. CONTRAST:  10 cc - administered into the gastric lumen. FLUOROSCOPY TIME:  Fluoroscopy Time: 6 minutes 24 seconds (25 mGy). COMPLICATIONS: None PROCEDURE: Informed written consent was obtained from the patient's family after a thorough discussion of the procedural risks, benefits and alternatives. All questions were addressed. Maximal Sterile Barrier Technique was utilized including caps, mask, sterile gowns, sterile gloves, sterile drape, hand hygiene and skin antiseptic. A timeout was performed prior to the initiation of the procedure. The procedure, risks, benefits, and alternatives were explained to the patient. Questions regarding the procedure were encouraged and answered. The patient understands and consents to the procedure. The epigastrium was prepped with Betadine in a sterile fashion, and a sterile drape was applied covering the operative field. A sterile gown and sterile gloves were used for the procedure. A 5-French orogastric tube is placed under fluoroscopic guidance. Scout imaging of the abdomen confirms barium within the transverse colon. The stomach was distended with gas. Under fluoroscopic guidance, an 18 gauge needle was utilized to puncture the anterior wall of the body of the stomach. An Amplatz wire was advanced through the needle passing a T fastener into the lumen of the stomach. The T fastener was secured for gastropexy. A  9-French sheath was inserted. A snare was advanced through the 9-French sheath. A Teena DunkBenson was advanced through the orogastric tube. It was snared then pulled out the oral cavity, pulling the snare, as well. Upon withdrawing the Bentson wire and catheter combination, the snare was dislodged in the esophagus. A Glidewire was then navigated through the  sheath into the esophagus. Glidewire would not pass through the length of the thoracic esophagus. A 4 French straight glide cath was then placed over the Glidewire, into the esophagus. Glidewire was removed and a stiff Glidewire is placed. This was successful in navigating through the oral cavity. Glide cath was then passed through the oral cavity exiting the mouth. Glidewire was removed and a Bentson wire was passed through and through. Catheter was removed, and then the snare was attached to the caudal end of the Bentson wire which was then pulled through the oral cavity. The leading edge of the gastrostomy was then attached to the snare. It was then pulled down the esophagus and out the percutaneous site. It was secured in place. Contrast was injected. No complication IMPRESSION: Status post fluoroscopic placed percutaneous gastrostomy tube, with 20 [place] pull-through. Signed, Yvone NeuJaime S. Loreta AveWagner, DO [facility] Electronically Signed   By: Gilmer MorJaime  Wagner D.O.   On: 02/09/2017 15:55     Medications:       Assessment/ Plan:  81 y.o. 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 Started on dialysis for volume overload and poor urine output. [facility]  1. Acute renal failure Baseline creatinine is unknown; BUN/Cr remain high - Creatinine below 2 on predialysis labs. Patient did complete hemodialysis today. Ultrafiltration achieved was 1.5 kg. Next dialysis tentatively for Friday. Unclear as to how much urine output the patient has.  2. Acute pulmonary edema, right lower lobe pneumonia - Ultrafiltration achieved today was 1.5 kg.   3. Anemia unspecified. Hemoglobin currently up to 9.1. Continue to monitor.  4. Hyponatremia.  Serum sodium up to 131 today. Continue to monitor serum sodium periodically.   LOS: 0 Mady HaagensenLATEEF, Fulton Merry 7/25/20183:29 PM  [facility] [place], [place] [phone]

## 2017-02-12 ENCOUNTER — Encounter (HOSPITAL_COMMUNITY): Payer: Medicare Other

## 2017-02-12 LAB — RENAL FUNCTION PANEL
ALBUMIN: 1.9 g/dL — AB (ref 3.5–5.0)
ANION GAP: 8 (ref 5–15)
BUN: 35 mg/dL — ABNORMAL HIGH (ref 6–20)
CALCIUM: 9.2 mg/dL (ref 8.9–10.3)
CO2: 26 mmol/L (ref 22–32)
Chloride: 97 mmol/L — ABNORMAL LOW (ref 101–111)
Creatinine, Ser: 1.63 mg/dL — ABNORMAL HIGH (ref 0.44–1.00)
GFR calc non Af Amer: 29 mL/min — ABNORMAL LOW (ref >60)
GFR, EST AFRICAN AMERICAN: 33 mL/min — AB (ref >60)
Glucose, Bld: 93 mg/dL (ref 65–99)
PHOSPHORUS: 1.5 mg/dL — AB (ref 2.5–4.6)
Potassium: 3.8 mmol/L (ref 3.5–5.1)
SODIUM: 131 mmol/L — AB (ref 135–145)

## 2017-02-12 LAB — CBC
HEMATOCRIT: 27 % — AB (ref 36.0–46.0)
HEMOGLOBIN: 8.4 g/dL — AB (ref 12.0–15.0)
MCH: 26.3 pg (ref 26.0–34.0)
MCHC: 31.1 g/dL (ref 30.0–36.0)
MCV: 84.6 fL (ref 78.0–100.0)
Platelets: 183 10*3/uL (ref 150–400)
RBC: 3.19 MIL/uL — AB (ref 3.87–5.11)
RDW: 20.8 % — ABNORMAL HIGH (ref 11.5–15.5)
WBC: 7.9 10*3/uL (ref 4.0–10.5)

## 2017-02-17 NOTE — Progress Notes (Signed)
  01/17/2017  Subjective:  Patient seen at bedside. Creatinine currently 1.6. Patient apparently had an episode of bradycardia earlier this a.m.   Objective:  Vital signs in last 24 hours:  Pulse 79 respirations 16 blood pressure 145/80 pulse ox 98%  Physical Exam: General: NAD   HEENT [place]/AT Oral mucosa moist   Neck supple  Pulm/lungs Coarse b/l, Nasal cannula oxygen   CVS/Heart Irregular rhythm no rubs  Abdomen:  Soft NTND PEG in place  Extremities: + dependent edema  Neurologic: Awake, did follow simple commands this a.m.   Skin: Scattered ecchymoses  Access: Rt IJ PC       Basic Metabolic Panel:   Recent Labs Lab 02/08/17 0554 02/09/17 0533 02/10/17 0633 02/07/2017 0435  NA 131* 129* 131* 131*  K 4.1 3.8 4.2 3.8  CL 97* 95* 98* 97*  CO2 30 27 25 26   GLUCOSE 107* 157* 107* 93  BUN 47* 25* 31* 35*  CREATININE 2.11* 1.42* 1.85* 1.63*  CALCIUM 9.3 8.5* 8.9 9.2  MG  --  1.9  --   --   PHOS 1.8* 1.2* 2.2* 1.5*     CBC:  Recent Labs Lab 02/08/17 0554 02/10/17 0633 02/07/2017 0435  WBC 6.2 8.3 7.9  HGB 8.0* 9.1* 8.4*  HCT 25.7* 29.0* 27.0*  MCV 85.7 86.3 84.6  PLT 123* 173 183      Lab Results  Component Value Date   HEPBSAG Negative 01/20/2017   HEPBSAB Non Reactive 01/20/2017   HEPBIGM Negative 01/20/2017      Microbiology:  No results found for this or any previous visit (from the past 240 hour(s)).  Coagulation Studies: No results for input(s): LABPROT, INR in the last 72 hours.  Urinalysis: No results for input(s): COLORURINE, LABSPEC, PHURINE, GLUCOSEU, HGBUR, BILIRUBINUR, KETONESUR, PROTEINUR, UROBILINOGEN, NITRITE, LEUKOCYTESUR in the last 72 hours.  Invalid input(s): APPERANCEUR    Imaging: No results found.   Medications:       Assessment/ Plan:  81 y.o. 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 Started on dialysis for volume overload and poor urine output. [facility]  1. Acute renal failure Baseline creatinine is unknown;  - Recently creatinine has trended down. Creatinine currently 1.6. Again unclear as to how much urine output she's having. We will consider stopping dialysis next week.  We will proceed with dialysis today.  2. Acute pulmonary edema, right lower lobe pneumonia - Continue ultrafiltration with dialysis for 1 additional session.   3. Anemia unspecified. Hemoglobin down to 8.4 this a.m. Continue to monitor.  4. Hyponatremia. Serum sodium remains stable at 131.  Continue to monitor serum sodium.   LOS: 0 Mady HaagensenLATEEF, Shyheim Tanney 7/27/20188:36 AM  [facility] [place], [place] [phone]

## 2017-02-17 DEATH — deceased

## 2019-05-31 IMAGING — DX DG CHEST 1V PORT
1 series · 1 of 1 positions shown · non-contrast
Comparison: None.

CLINICAL DATA: Respiratory failure.

EXAM:
PORTABLE CHEST 1 VIEW

[chest ap]
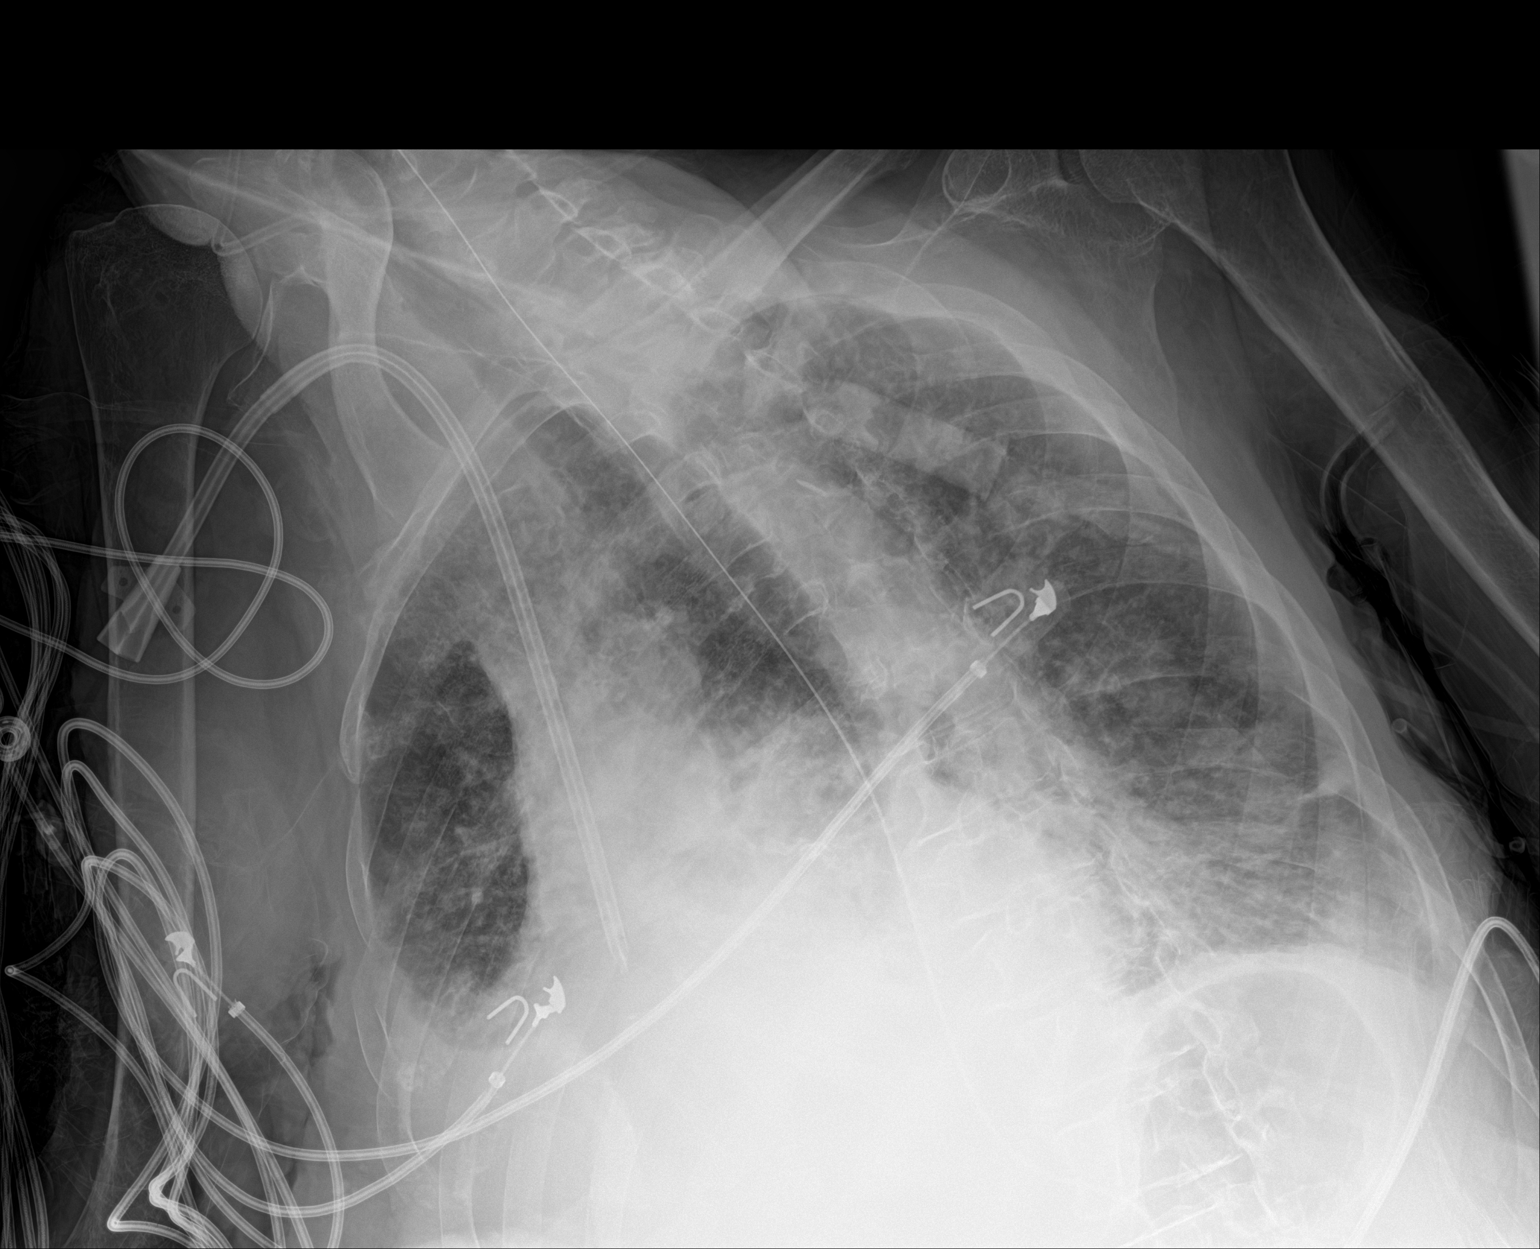

[1 of 1 positions shown; findings below may reference images not displayed]

FINDINGS: Nasogastric tube enters the abdomen. Right internal jugular central
line tips at the SVC RA junction or proximal right atrium. The
patient is rotated markedly towards the right. There is probably
pulmonary edema. There is a right effusion with right lower lobe
collapse. Abnormal density in the left lower lobe could represent
pneumonia.
IMPRESSION: Markedly rotated film. Right effusion with right lower lobe
collapse. Possible left lower lobe pneumonia. Suspicion of pulmonary
edema/ fluid overload.

## 2019-06-13 IMAGING — CT CT ABDOMEN W/O CM
2 of 4 series · 14 of 46 positions shown, 16 images · non-contrast
Comparison: Abdominal radiograph 01/19/2017; chest x-ray 01/20/2017

CLINICAL DATA: 81-year-old female with protein calorie
malnutrition. Evaluate anatomy for percutaneous gastrostomy tube
placement.

EXAM:
CT ABDOMEN WITHOUT CONTRAST
TECHNIQUE: Multidetector CT imaging of the abdomen was performed following the
standard protocol without IV contrast.

[Series 3: a/p w/o 5mm · axial · non-contrast · 0.86mm/px · z∈[-239,+46]mm · 11 of 65 slices shown, 13 images]
[im 4/65  soft-tissue]
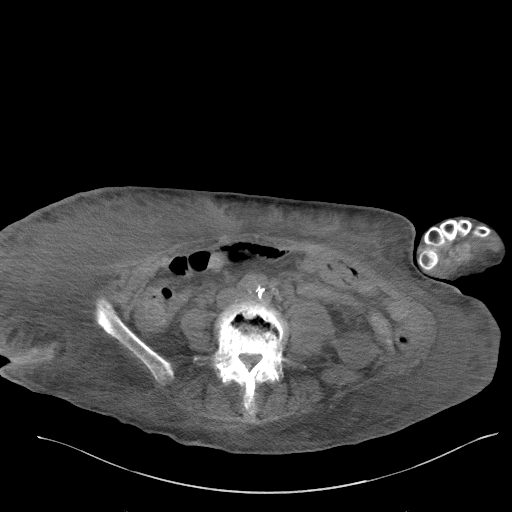
[im 4/65  bone]
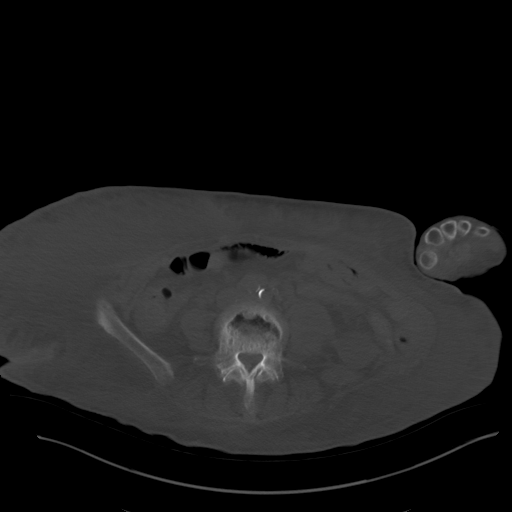
[im 10/65  soft-tissue]
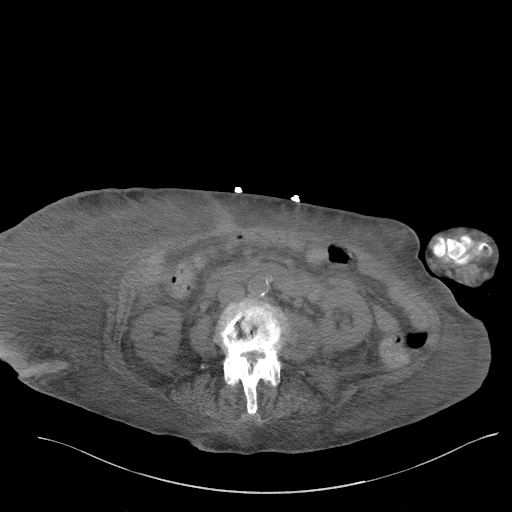
[im 17/65  soft-tissue]
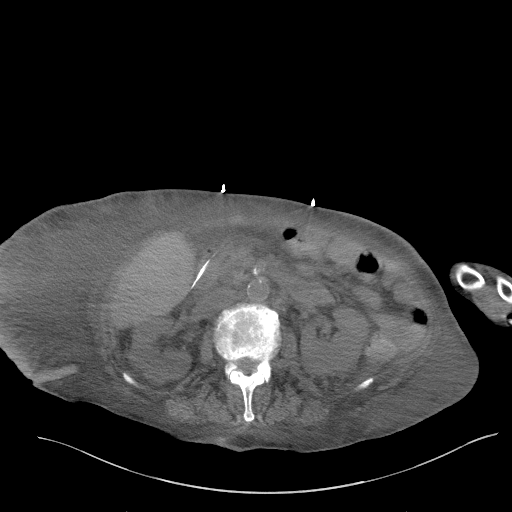
[im 23/65  soft-tissue]
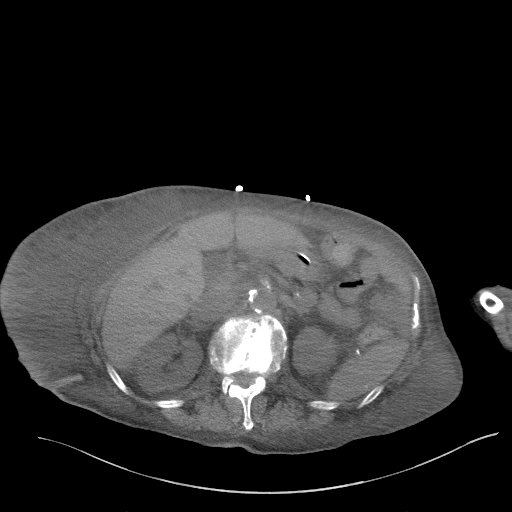
[im 26/65  soft-tissue]
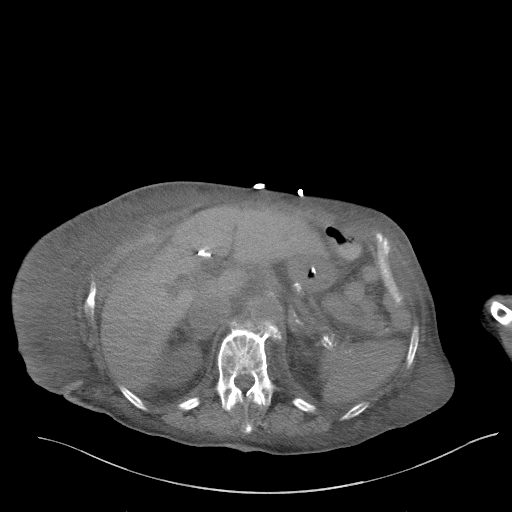
[im 33/65  soft-tissue]
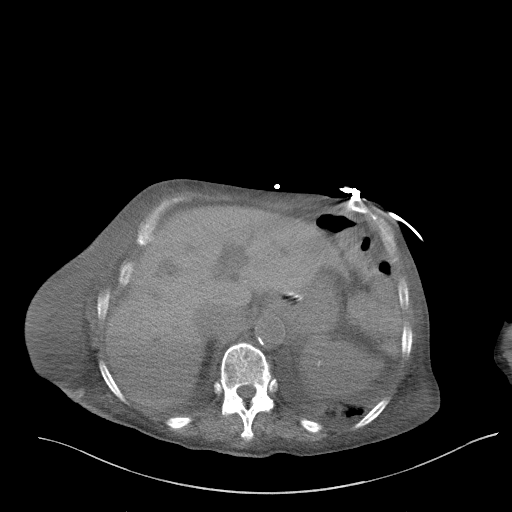
[im 39/65  soft-tissue]
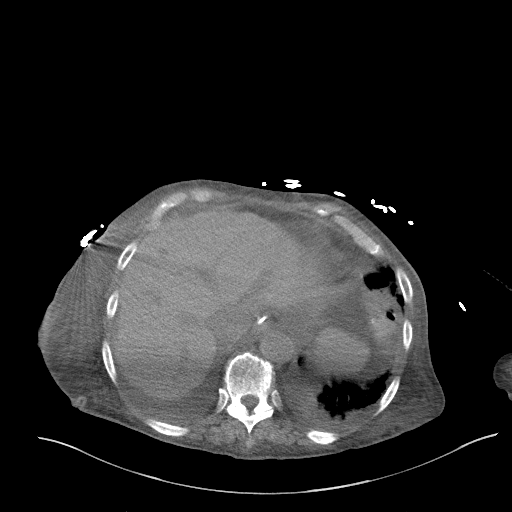
[im 42/65  soft-tissue]
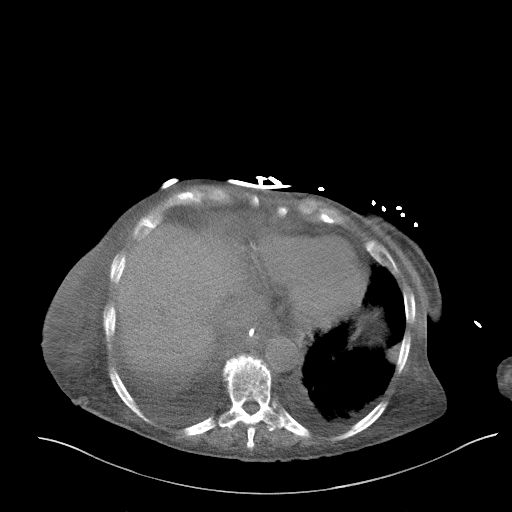
[im 49/65  soft-tissue]
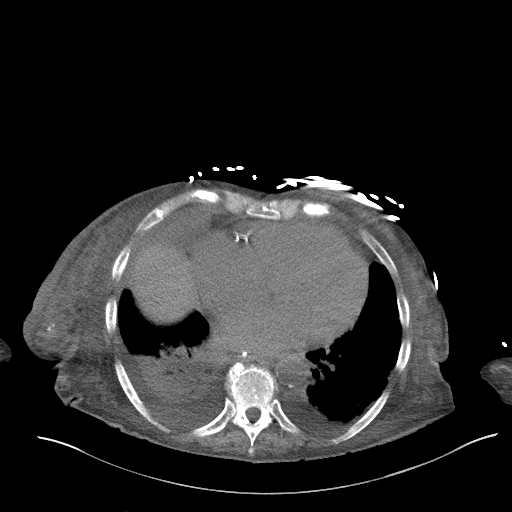
[im 49/65  bone]
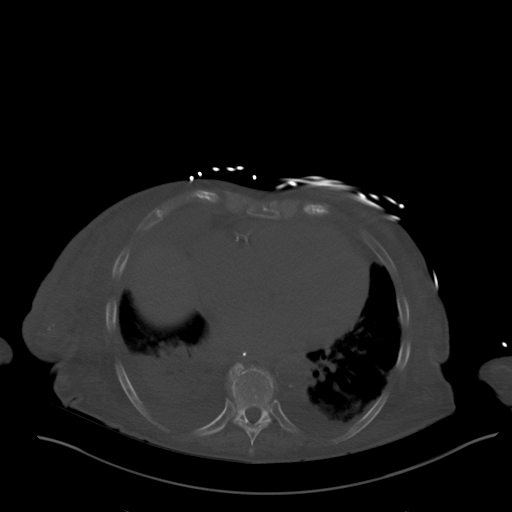
[im 55/65  soft-tissue]
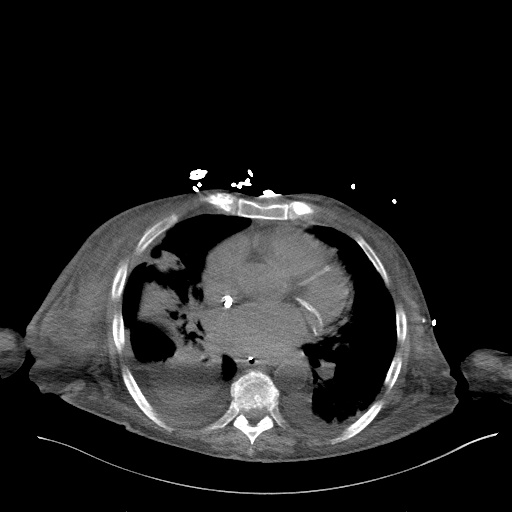
[im 61/65  soft-tissue]
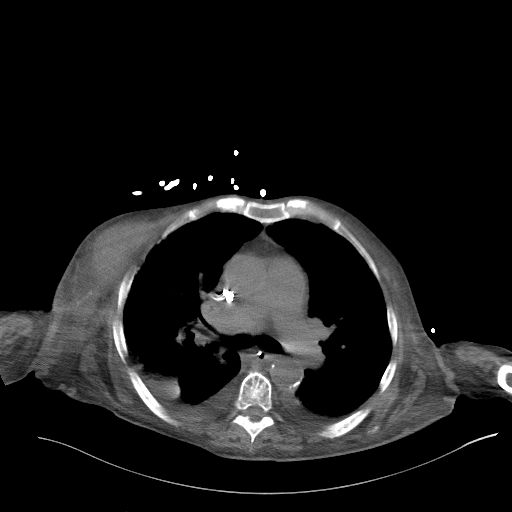

[Series 6: a/p w/o cor · coronal · non-contrast · 0.64mm/px · 3 of 120 slices shown]
[im 40/120  soft-tissue]
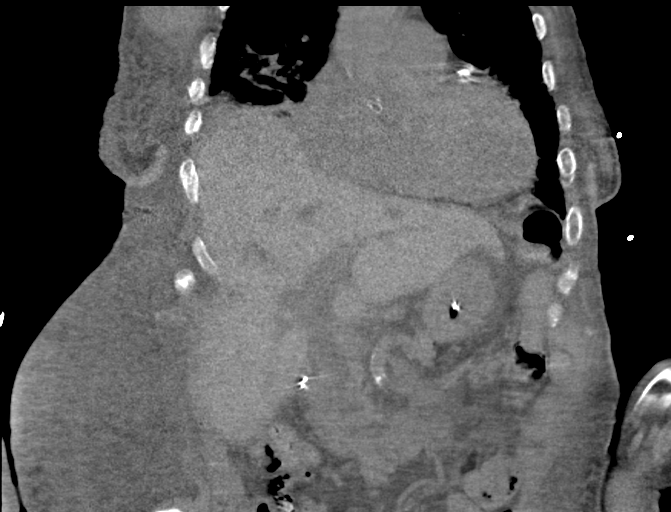
[im 53/120  soft-tissue]
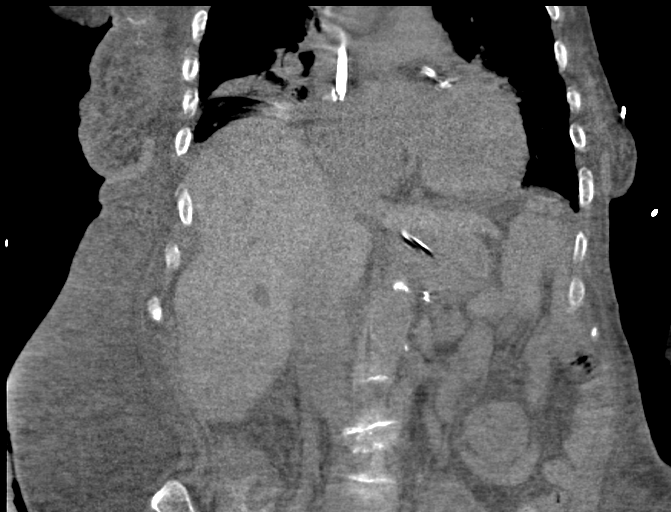
[im 67/120  soft-tissue]
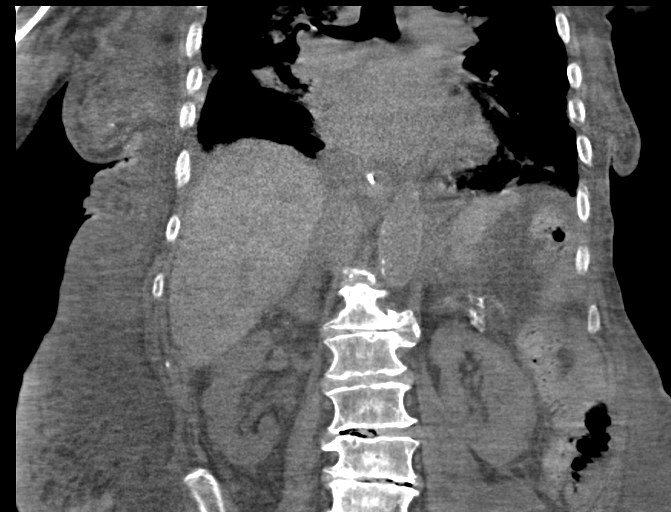

[14 of 46 positions shown; findings below may reference images not displayed]

FINDINGS: Lower chest: Respiratory motion limits evaluation of the visualized
lower lungs. However, there is a 1.5 cm nodular opacity in the left
upper lobe with additional patchy and somewhat nodular opacities in
the left lower lobe. The bronchial wall thickening and
bronchiectasis is present in the visualized portion of the right
lung with partial atelectasis and/ or scarring in the right middle
lobe and lingula. A few small nodular opacities are also present in
the right lower lobe. Small bilateral pleural effusions.

Cardiomegaly. Extensive calcifications along the course of the
coronary arteries. A tunneled hemodialysis catheter is present. The
tip terminates at the superior cavoatrial junction. A nasogastric
tube is present. The tip terminates in the first portion of the
duodenum.

Hepatobiliary: The gallbladder is surgically absent. Mild intra and
extrahepatic biliary duct dilatation without definite mass or stone.
The liver parenchyma is high in attenuation.

Pancreas: Atrophic.  No definite mass.

Spleen: Normal in size without focal abnormality.

Adrenals/Urinary Tract: No focal adrenal mass. No hydronephrosis or
nephrolithiasis.

Stomach/Bowel: No evidence of bowel obstruction or focal bowel wall
thickening in the visualized portions. Normal gastric anatomy. No
evidence of bypass surgery or other abnormality.

Vascular/Lymphatic: Limited evaluation in the absence of intravenous
contrast. Calcifications present throughout the abdominal aorta. No
aneurysm visualized.

Other: Extensive skin thickening and interstitial stranding
throughout the subcutaneous fat consistent with anasarca.

Musculoskeletal: Incompletely imaged 8.8 x 4.4 cm high attenuation
soft tissue mass in the subcutaneous fat of the right upper chest.
This abnormality lies above the breast. The contralateral side is
unremarkable. Additionally, there is asymmetry in the psoas muscles
with relative high attenuation and expansion on the left psoas
muscle concerning for retroperitoneal hematoma. Additionally, there
are what appear to be fluid collections in the retroperitoneal fat
posterior to the left psoas muscle.
IMPRESSION: 1. Incompletely imaged multifocal nodular opacities throughout the
lungs the largest of which measures 1.5 cm in the left upper lobe.
Differential considerations include multifocal pneumonia, and
metastatic disease. If the patient does not clearly have multifocal
pneumonia, then further evaluation with a dedicated chest CT is
recommended. If the patient does have clinical symptoms of a
pneumonia, then a follow-up CT scan of the chest is recommended in
4- 6 weeks following an appropriate course of therapy to document
resolution of the nodular opacities.
2. Intermediate attenuation density in the subcutaneous fat of the
right upper chest superior to the breast is favored to represent a
subcutaneous hematoma. An incompletely imaged and superiorly
displaced breast prostheses or other soft tissue mass is possible
but considered less likely given the density in the absence of
associated calcifications. This finding could also be further
evaluated by CT scan of the chest.
3. Incompletely imaged but suspected left psoas hematoma versus
abscess with adjacent retroperitoneal fluid collection. Consider
further evaluation with a CT scan of the abdomen and pelvis with
intravenous contrast.
4. Normal gastric anatomy for percutaneous gastrostomy tube
placement.
5. Mild intra and extrahepatic biliary ductal dilatation is favored
to be secondary to patient age and post cholecystectomy status.
Recommend clinical correlation with serum LFTs and bilirubin to
evaluate for evidence of obstruction.
6.  Aortic Atherosclerosis ([id number])
7. Cardiomegaly.
8. Coronary artery calcifications.
9. Small bilateral pleural effusions.
10. Anasarca.
11. The tip of the nasogastric tube is positioned in the first
portion of the duodenum. Consider pulling back several cm for more
optimal placement.
12. Well-positioned hemodialysis catheter with the tip at the
superior cavoatrial junction.
